# Patient Record
Sex: Male | Born: 1995 | Hispanic: No | Marital: Single | Smoking: Never smoker
Health system: Southern US, Community
[De-identification: ages and names within clinical notes are randomized; demographics above are authoritative.]

## PROBLEM LIST (undated history)

## (undated) DIAGNOSIS — H518 Other specified disorders of binocular movement: Secondary | ICD-10-CM

## (undated) DIAGNOSIS — H5010 Unspecified exotropia: Secondary | ICD-10-CM

## (undated) DIAGNOSIS — T18108A Unspecified foreign body in esophagus causing other injury, initial encounter: Secondary | ICD-10-CM

## (undated) DIAGNOSIS — H02402 Unspecified ptosis of left eyelid: Secondary | ICD-10-CM

## (undated) DIAGNOSIS — H5210 Myopia, unspecified eye: Secondary | ICD-10-CM

## (undated) DIAGNOSIS — H5500 Unspecified nystagmus: Secondary | ICD-10-CM

## (undated) DIAGNOSIS — H52209 Unspecified astigmatism, unspecified eye: Secondary | ICD-10-CM

## (undated) DIAGNOSIS — H53002 Unspecified amblyopia, left eye: Secondary | ICD-10-CM

## (undated) HISTORY — PX: FRONTALIS SUSPENSION: SHX1688

## (undated) HISTORY — PX: TONSILLECTOMY: SUR1361

## (undated) HISTORY — PX: OTHER SURGICAL HISTORY: SHX169

## (undated) HISTORY — PX: EYE SURGERY: SHX253

## (undated) HISTORY — PX: STRABISMUS SURGERY: SHX218

---
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
## 2013-12-27 ENCOUNTER — Ambulatory Visit: Payer: Self-pay | Admitting: Internal Medicine

## 2014-01-10 ENCOUNTER — Ambulatory Visit: Payer: Self-pay | Admitting: Physician Assistant

## 2014-08-28 ENCOUNTER — Emergency Department
Admit: 2014-08-28 | Disposition: A | Discharge status: Home / Self Care | Payer: Self-pay | Admitting: Emergency Medicine

## 2015-01-03 ENCOUNTER — Encounter: Payer: Self-pay | Admitting: Emergency Medicine

## 2015-01-03 ENCOUNTER — Emergency Department: Payer: Commercial Managed Care - HMO | Admitting: Anesthesiology

## 2015-01-03 ENCOUNTER — Encounter
Admission: EM | Disposition: A | Discharge status: Home / Self Care | Payer: Self-pay | Source: Home / Self Care | Attending: Emergency Medicine

## 2015-01-03 ENCOUNTER — Emergency Department
Admission: EM | Admit: 2015-01-03 | Discharge: 2015-01-03 | Disposition: A | Discharge status: Home / Self Care | Payer: Commercial Managed Care - HMO | Attending: Emergency Medicine | Admitting: Emergency Medicine

## 2015-01-03 DIAGNOSIS — T18128A Food in esophagus causing other injury, initial encounter: Secondary | ICD-10-CM | POA: Insufficient documentation

## 2015-01-03 DIAGNOSIS — K2970 Gastritis, unspecified, without bleeding: Secondary | ICD-10-CM | POA: Diagnosis not present

## 2015-01-03 DIAGNOSIS — X58XXXA Exposure to other specified factors, initial encounter: Secondary | ICD-10-CM | POA: Diagnosis not present

## 2015-01-03 DIAGNOSIS — K222 Esophageal obstruction: Secondary | ICD-10-CM | POA: Diagnosis not present

## 2015-01-03 DIAGNOSIS — Y939 Activity, unspecified: Secondary | ICD-10-CM | POA: Insufficient documentation

## 2015-01-03 DIAGNOSIS — Y929 Unspecified place or not applicable: Secondary | ICD-10-CM | POA: Insufficient documentation

## 2015-01-03 DIAGNOSIS — R1310 Dysphagia, unspecified: Secondary | ICD-10-CM | POA: Diagnosis present

## 2015-01-03 HISTORY — PX: ESOPHAGOGASTRODUODENOSCOPY (EGD) WITH PROPOFOL: SHX5813

## 2015-01-03 LAB — COMPREHENSIVE METABOLIC PANEL
ALT: 19 U/L (ref 17–63)
AST: 25 U/L (ref 15–41)
Albumin: 4.6 g/dL (ref 3.5–5.0)
Alkaline Phosphatase: 73 U/L (ref 38–126)
Anion gap: 7 (ref 5–15)
BILIRUBIN TOTAL: 0.3 mg/dL (ref 0.3–1.2)
BUN: 14 mg/dL (ref 6–20)
CO2: 28 mmol/L (ref 22–32)
CREATININE: 1.21 mg/dL (ref 0.61–1.24)
Calcium: 9.6 mg/dL (ref 8.9–10.3)
Chloride: 106 mmol/L (ref 101–111)
GFR calc Af Amer: >60 mL/min (ref >60)
Glucose, Bld: 71 mg/dL (ref 65–99)
Potassium: 4.2 mmol/L (ref 3.5–5.1)
Sodium: 141 mmol/L (ref 135–145)
TOTAL PROTEIN: 7.9 g/dL (ref 6.5–8.1)

## 2015-01-03 LAB — CBC
HCT: 46.9 % (ref 40.0–52.0)
Hemoglobin: 15.6 g/dL (ref 13.0–18.0)
MCH: 26 pg (ref 26.0–34.0)
MCHC: 33.3 g/dL (ref 32.0–36.0)
MCV: 78.2 fL — ABNORMAL LOW (ref 80.0–100.0)
PLATELETS: 289 10*3/uL (ref 150–440)
RBC: 6 MIL/uL — ABNORMAL HIGH (ref 4.40–5.90)
RDW: 15.3 % — AB (ref 11.5–14.5)
WBC: 6.9 10*3/uL (ref 3.8–10.6)

## 2015-01-03 SURGERY — ESOPHAGOGASTRODUODENOSCOPY (EGD) WITH PROPOFOL
Anesthesia: General

## 2015-01-03 MED ORDER — DEXAMETHASONE SODIUM PHOSPHATE 4 MG/ML IJ SOLN
INTRAMUSCULAR | Status: DC | PRN
  Administered 2015-01-03: 5 mg via INTRAVENOUS

## 2015-01-03 MED ORDER — FENTANYL CITRATE (PF) 100 MCG/2ML IJ SOLN: INTRAMUSCULAR | Status: DC | PRN

## 2015-01-03 MED ORDER — OXYCODONE HCL 5 MG PO TABS: 5.0000 mg | ORAL_TABLET | Freq: Once | ORAL | Status: DC | PRN

## 2015-01-03 MED ORDER — MIDAZOLAM HCL 2 MG/2ML IJ SOLN
INTRAMUSCULAR | Status: DC | PRN
  Administered 2015-01-03: 2 mg via INTRAVENOUS

## 2015-01-03 MED ORDER — OXYCODONE HCL 5 MG/5ML PO SOLN: 5.0000 mg | Freq: Once | ORAL | Status: DC | PRN

## 2015-01-03 MED ORDER — PROPOFOL 10 MG/ML IV BOLUS
INTRAVENOUS | Status: DC | PRN
  Administered 2015-01-03: 200 mg via INTRAVENOUS

## 2015-01-03 MED ORDER — SODIUM CHLORIDE 0.9 % IV SOLN
INTRAVENOUS | Status: DC
  Administered 2015-01-03: 1000 mL via INTRAVENOUS

## 2015-01-03 MED ORDER — SUCCINYLCHOLINE CHLORIDE 20 MG/ML IJ SOLN
INTRAMUSCULAR | Status: DC | PRN
  Administered 2015-01-03: 120 mg via INTRAVENOUS

## 2015-01-03 MED ORDER — FENTANYL CITRATE (PF) 100 MCG/2ML IJ SOLN
INTRAMUSCULAR | Status: DC | PRN
  Administered 2015-01-03: 50 ug via INTRAVENOUS

## 2015-01-03 MED ORDER — ROCURONIUM BROMIDE 100 MG/10ML IV SOLN
INTRAVENOUS | Status: DC | PRN
  Administered 2015-01-03: 10 mg via INTRAVENOUS

## 2015-01-03 MED ORDER — LIDOCAINE HCL (CARDIAC) 20 MG/ML IV SOLN
INTRAVENOUS | Status: DC | PRN
  Administered 2015-01-03 (×2): 100 mg via INTRAVENOUS

## 2015-01-03 MED ORDER — FENTANYL CITRATE (PF) 100 MCG/2ML IJ SOLN: 25.0000 ug | INTRAMUSCULAR | Status: DC | PRN

## 2015-01-03 MED ORDER — ONDANSETRON HCL 4 MG/2ML IJ SOLN
INTRAMUSCULAR | Status: DC | PRN
Start: 2015-01-03 — End: 2015-01-03
  Administered 2015-01-03: 4 mg via INTRAVENOUS

## 2015-01-03 NOTE — Consult Note (Signed)
   Primary Care Physician:  Pcp Not In System Primary Gastroenterologist:  Dr. Mechele Collin  Pre-Procedure History & Physical: HPI:  Rodney Callahan is a 19 y.o. male is here for an endoscopy.  He has a food impaction in esophagus while eating today and cannot swallow saliva or carbonated beverages.   History reviewed. No pertinent past medical history.  Past Surgical History  Procedure Laterality Date  . Eye surgery      Prior to Admission medications   Not on File    Allergies as of 01/03/2015  . (No Known Allergies)    History reviewed. No pertinent family history.  Social History   Social History  . Marital Status: Single    Spouse Name: N/A  . Number of Children: N/A  . Years of Education: N/A   Occupational History  . Not on file.   Social History Main Topics  . Smoking status: Never Smoker   . Smokeless tobacco: Not on file  . Alcohol Use: No  . Drug Use: Not on file  . Sexual Activity: Not on file   Other Topics Concern  . Not on file   Social History Narrative  . No narrative on file    Review of Systems: See HPI, otherwise negative ROS  Physical Exam: BP 166/85 mmHg  Pulse 88  Temp(Src) 98.7 F (37.1 C) (Oral)  Resp 18  Ht  (2.032 m)  Wt 117.935 kg (260 lb)  BMI 28.56 kg/m2  SpO2 100% General:   Alert,  pleasant and cooperative in NAD, Pt unable to swallow saliva, carbonated beverages did not make the food bolus go down. Head:  Normocephalic and atraumatic. Neck:  Supple; no masses or thyromegaly. Lungs:  Clear throughout to auscultation.    Heart:  Regular rate and rhythm. Abdomen:  Soft, nontender and nondistended. Normal bowel sounds, without guarding, and without rebound.   Neurologic:  Alert and  oriented x4;  grossly normal neurologically.  Impression/Plan: Rodney Callahan is here for an endoscopy to be performed for foreign body removal from esophagus.  Risks, benefits, limitations, and alternatives  regarding  endoscopy have been reviewed with the patient.  Questions have been answered.  All parties agreeable.   Lynnae Prude, MD  01/03/2015, 6:03 PM

## 2015-01-03 NOTE — Discharge Instructions (Signed)

## 2015-01-03 NOTE — ED Provider Notes (Signed)
[facility] Emergency Department Provider Note     Time seen: ----------------------------------------- 5:03 PM on 01/03/2015 -----------------------------------------    I have reviewed the triage vital signs and the nursing notes.   HISTORY  Chief Complaint Emesis and Dysphagia    HPI Oneal Schoenberger is a 19 y.o. male who presents ER for trouble swallowing since 2 PM patient states he was eating a fish fajita and then abruptly afterwards he felt it get Stuck in his throat and he is been unable to swallow since that period time.Patient can no longer tolerate even swallowing saliva, has tried vomiting but is not able to bring anything up.   History reviewed. No pertinent past medical history.  There are no active problems to display for this patient.   Past Surgical History  Procedure Laterality Date  . Eye surgery      Allergies Review of patient's allergies indicates no known allergies.  Social History Social History  Substance Use Topics  . Smoking status: Never Smoker   . Smokeless tobacco: None  . Alcohol Use: No    Review of Systems Constitutional: Negative for fever. Eyes: Negative for visual changes. ENT: Negative for sore throat. Positive for foreign body sensation Cardiovascular: Negative for chest pain. Respiratory: Negative for shortness of breath. Gastrointestinal: Negative for abdominal pain, positive for vomiting Genitourinary: Negative for dysuria. Musculoskeletal: Negative for back pain. Skin: Negative for rash. Neurological: Negative for headaches, focal weakness or numbness.  10-point ROS otherwise negative.  ____________________________________________   PHYSICAL EXAM:  VITAL SIGNS: ED Triage Vitals  Enc Vitals Group     BP 01/03/15 1616 166/85 mmHg     Pulse Rate 01/03/15 1616 88     Resp 01/03/15 1616 18     Temp 01/03/15 1616 98.7 F (37.1 C)     Temp Source 01/03/15 1616 Oral     SpO2  01/03/15 1616 100 %     Weight 01/03/15 1616 260 lb (117.935 kg)     Height 01/03/15 1620 6\' 8"  (2.032 m)     Head Cir --      Peak Flow --      Pain Score 01/03/15 1618 5     Pain Loc --      Pain Edu? --      Excl. in GC? --     Constitutional: Alert and oriented. Mild distress Eyes: Conjunctivae are normal. PERRL. Normal extraocular movements. ENT   Head: Normocephalic and atraumatic.   Nose: No congestion/rhinnorhea.   Mouth/Throat: Mucous membranes are moist. Pharyngeal erythema is present   Neck: No stridor. Cardiovascular: Normal rate, regular rhythm. Normal and symmetric distal pulses are present in all extremities. No murmurs, rubs, or gallops. Respiratory: Normal respiratory effort without tachypnea nor retractions. Breath sounds are clear and equal bilaterally. No wheezes/rales/rhonchi. Gastrointestinal: Soft and nontender. No distention. No abdominal bruits.  Musculoskeletal: Nontender with normal range of motion in all extremities. No joint effusions.  No lower extremity tenderness nor edema. Neurologic:  Normal speech and language. No gross focal neurologic deficits are appreciated. Speech is normal. No gait instability. Skin:  Skin is warm, dry and intact. No rash noted. Psychiatric: Mood and affect are normal. Speech and behavior are normal. Patient exhibits appropriate insight and judgment.  ____________________________________________  ED COURSE:  Pertinent labs & imaging results that were available during my care of the patient were reviewed by me and considered in my medical decision making (see chart for details). Patient clinically with esophageal food impaction,  will discuss with GI. Likely he'll need endoscopy and bolus extraction ____________________________________________    LABS (pertinent positives/negatives)  Labs Reviewed  CBC - Abnormal; Notable for the following:    RBC 6.00 (*)    MCV 78.2 (*)    RDW 15.3 (*)    All other  components within normal limits  COMPREHENSIVE METABOLIC PANEL    ____________________________________________  FINAL ASSESSMENT AND PLAN  Esophageal food impaction  Plan: Patient with labs and imaging as dictated above. Patient did not have success after taking an a carbonated beverage. Dr. Mechele Collin isn't to see the patient, will take to endoscopy to have food bolus removal.   Emily Filbert, MD   Emily Filbert, MD 01/03/15 [phone]

## 2015-01-03 NOTE — Transfer of Care (Signed)
Immediate Anesthesia Transfer of Care Note  Patient: Rodney Callahan  Procedure(s) Performed: Procedure(s): ESOPHAGOGASTRODUODENOSCOPY (EGD) WITH PROPOFOL (N/A)  Patient Location: PACU  Anesthesia Type:General  Level of Consciousness: sedated  Airway & Oxygen Therapy: Patient Spontanous Breathing and Patient connected to nasal cannula oxygen  Post-op Assessment: Report given to RN and Post -op Vital signs reviewed and stable  Post vital signs: Reviewed and stable  Last Vitals:  Filed Vitals:   01/03/15 1935  BP: 131/76  Pulse:   Temp: 36.3 C  Resp: 16    Complications: No apparent anesthesia complications

## 2015-01-03 NOTE — Anesthesia Procedure Notes (Signed)
Procedure Name: Intubation Date/Time: 01/03/2015 6:22 PM Performed by: Irving Burton Pre-anesthesia Checklist: Patient identified, Emergency Drugs available, Suction available and Patient being monitored Patient Re-evaluated:Patient Re-evaluated prior to inductionOxygen Delivery Method: Circle system utilized Preoxygenation: Pre-oxygenation with 100% oxygen Intubation Type: IV induction, Rapid sequence and Cricoid Pressure applied Laryngoscope Size: Mac and 4 Grade View: Grade II Tube type: Oral Tube size: 7.0 mm Number of attempts: 1 Airway Equipment and Method: Stylet Placement Confirmation: ETT inserted through vocal cords under direct vision,  positive ETCO2 and breath sounds checked- equal and bilateral Secured at: 24 cm Tube secured with: Tape Dental Injury: Teeth and Oropharynx as per pre-operative assessment

## 2015-01-03 NOTE — Op Note (Signed)
[facility] Gastroenterology Patient Name: Rodney Callahan Procedure Date: 01/03/2015 5:44 PM MRN: [id number] Account #: [id number] Date of Birth: Dec 10, 1995 Admit Type: Inpatient Age: 19 Room: [facility] ENDO ROOM 1 Gender: Male Note Status: Finalized Procedure:         Upper GI endoscopy Indications:       Foreign body in the esophagus Providers:         Scot Jun, MD Referring MD:      No Local Md, MD (Referring MD) Medicines:         General Anesthesia Complications:     No immediate complications. Procedure:         Pre-Anesthesia Assessment:                    - After reviewing the risks and benefits, the patient was                     deemed in satisfactory condition to undergo the procedure.                    After obtaining informed consent, the endoscope was passed                     under direct vision. Throughout the procedure, the                     patient's blood pressure, pulse, and oxygen saturations                     were monitored continuously. The Olympus GIF-160 endoscope                     (S#. [id number]) was introduced through the mouth, and                     advanced to the second part of duodenum. The upper GI                     endoscopy was accomplished without difficulty. The patient                     tolerated the procedure well. Findings:      A low-grade of narrowing, mild and moderate but multiple Schatzki rings       (acquired) was found in the middle third of the esophagus and in the       lower third of the esophagus.      Food was found in the lower third of the esophagus. Removal of food was       accomplished. Using a raptor forceps and a snare. Some was removed and       some was pushed into the stomach.      Patchy minimal inflammation characterized by erythema and granularity       was found in the gastric body.      The examined duodenum was normal. Impression:        - Low-grade of narrowing, mild and moderate  Schatzki ring.                    - Food in the lower third of the esophagus. Removal was                     successful.                    -  Gastritis.                    - Normal examined duodenum. Recommendation:    - soft food for 3 days, eat slowly, chew well, take small                     bites. Schedule return endoscopy to dilate esophagus. Scot Jun, MD 01/03/2015 7:17:34 PM This report has been signed electronically. Number of Addenda: 0 Note Initiated On: 01/03/2015 5:44 PM      [facility]

## 2015-01-03 NOTE — ED Notes (Signed)
Pt to endo via stretcher with nurse and orderly.  Pt alert.  Family with pt.

## 2015-01-03 NOTE — ED Notes (Signed)
Pt presents with vomiting and trouble swallowing starting today.

## 2015-01-03 NOTE — ED Notes (Signed)
Pt has hx of delayed swallowing, states at 2pm he ate some fajitas and has not been able to swallow saliva ever since, pt spitting up in bag, pt able to speak in full sentances in no respiratory distress

## 2015-01-03 NOTE — Anesthesia Preprocedure Evaluation (Addendum)
Anesthesia Evaluation  Patient identified by MRN, date of birth, ID band Patient awake    Reviewed: Allergy & Precautions, H&P , NPO status , Patient's Chart, lab work & pertinent test results  Airway Mallampati: II  TM Distance: >3 FB Neck ROM: full    Dental no notable dental hx. (+) Teeth Intact   Pulmonary neg shortness of breath, asthma ,  breath sounds clear to auscultation  Pulmonary exam normal       Cardiovascular negative cardio ROS Normal cardiovascular examRhythm:regular Rate:Normal     Neuro/Psych negative neurological ROS  negative psych ROS   GI/Hepatic negative GI ROS, Neg liver ROS,   Endo/Other  negative endocrine ROS  Renal/GU negative Renal ROS  negative genitourinary   Musculoskeletal   Abdominal   Peds  Hematology negative hematology ROS (+)   Anesthesia Other Findings hx of delayed swallowing, states at 2pm he ate some fajitas and has not been able to swallow saliva ever since   Reproductive/Obstetrics negative OB ROS                            Anesthesia Physical Anesthesia Plan  ASA: III and emergent  Anesthesia Plan: General ETT   Post-op Pain Management:    Induction: Rapid sequence and Intravenous  Airway Management Planned:   Additional Equipment:   Intra-op Plan:   Post-operative Plan:   Informed Consent: I have reviewed the patients History and Physical, chart, labs and discussed the procedure including the risks, benefits and alternatives for the proposed anesthesia with the patient or authorized representative who has indicated his/her understanding and acceptance.   Dental Advisory Given  Plan Discussed with: Anesthesiologist, CRNA and Surgeon  Anesthesia Plan Comments:         Anesthesia Quick Evaluation

## 2015-01-04 ENCOUNTER — Encounter: Payer: Self-pay | Admitting: Unknown Physician Specialty

## 2015-01-04 NOTE — Anesthesia Postprocedure Evaluation (Signed)
  Anesthesia Post-op Note  Patient: Rodney Callahan  Procedure(s) Performed: Procedure(s): ESOPHAGOGASTRODUODENOSCOPY (EGD) WITH PROPOFOL (N/A)  Anesthesia type:General ETT  Patient location: PACU  Post pain: Pain level controlled  Post assessment: Post-op Vital signs reviewed, Patient's Cardiovascular Status Stable, Respiratory Function Stable, Patent Airway and No signs of Nausea or vomiting  Post vital signs: Reviewed and stable  Last Vitals:  Filed Vitals:   01/03/15 2005  BP:   Pulse:   Temp: 36.6 C  Resp:     Level of consciousness: awake, alert  and patient cooperative  Complications: No apparent anesthesia complications

## 2015-04-26 ENCOUNTER — Encounter: Payer: Self-pay | Admitting: *Deleted

## 2015-04-29 ENCOUNTER — Ambulatory Visit: Payer: Commercial Managed Care - HMO | Admitting: Anesthesiology

## 2015-04-29 ENCOUNTER — Encounter
Admission: RE | Disposition: A | Discharge status: Home / Self Care | Payer: Self-pay | Source: Ambulatory Visit | Attending: Unknown Physician Specialty

## 2015-04-29 ENCOUNTER — Ambulatory Visit
Admission: RE | Admit: 2015-04-29 | Discharge: 2015-04-29 | Disposition: A | Discharge status: Home / Self Care | Payer: Commercial Managed Care - HMO | Source: Ambulatory Visit | Attending: Unknown Physician Specialty | Admitting: Unknown Physician Specialty

## 2015-04-29 ENCOUNTER — Encounter: Payer: Self-pay | Admitting: *Deleted

## 2015-04-29 DIAGNOSIS — H5210 Myopia, unspecified eye: Secondary | ICD-10-CM | POA: Insufficient documentation

## 2015-04-29 DIAGNOSIS — R1310 Dysphagia, unspecified: Secondary | ICD-10-CM | POA: Diagnosis present

## 2015-04-29 DIAGNOSIS — H578 Other specified disorders of eye and adnexa: Secondary | ICD-10-CM | POA: Diagnosis not present

## 2015-04-29 DIAGNOSIS — H53002 Unspecified amblyopia, left eye: Secondary | ICD-10-CM | POA: Insufficient documentation

## 2015-04-29 DIAGNOSIS — H5500 Unspecified nystagmus: Secondary | ICD-10-CM | POA: Insufficient documentation

## 2015-04-29 DIAGNOSIS — K222 Esophageal obstruction: Secondary | ICD-10-CM | POA: Insufficient documentation

## 2015-04-29 DIAGNOSIS — K2289 Other specified disease of esophagus: Secondary | ICD-10-CM

## 2015-04-29 DIAGNOSIS — K228 Other specified diseases of esophagus: Secondary | ICD-10-CM

## 2015-04-29 DIAGNOSIS — H52209 Unspecified astigmatism, unspecified eye: Secondary | ICD-10-CM | POA: Insufficient documentation

## 2015-04-29 HISTORY — DX: Unspecified nystagmus: H55.00

## 2015-04-29 HISTORY — DX: Unspecified amblyopia, left eye: H53.002

## 2015-04-29 HISTORY — PX: ESOPHAGOGASTRODUODENOSCOPY (EGD) WITH PROPOFOL: SHX5813

## 2015-04-29 HISTORY — DX: Other specified disorders of binocular movement: H51.8

## 2015-04-29 HISTORY — PX: SAVORY DILATION: SHX5439

## 2015-04-29 HISTORY — DX: Unspecified ptosis of left eyelid: H02.402

## 2015-04-29 HISTORY — DX: Unspecified exotropia: H50.10

## 2015-04-29 HISTORY — DX: Myopia, unspecified eye: H52.10

## 2015-04-29 HISTORY — DX: Unspecified astigmatism, unspecified eye: H52.209

## 2015-04-29 SURGERY — ESOPHAGOGASTRODUODENOSCOPY (EGD) WITH PROPOFOL
Anesthesia: General

## 2015-04-29 MED ORDER — BUTAMBEN-TETRACAINE-BENZOCAINE 2-2-14 % EX AERO
INHALATION_SPRAY | CUTANEOUS | Status: DC | PRN
  Administered 2015-04-29: 1 via TOPICAL

## 2015-04-29 MED ORDER — PROPOFOL 500 MG/50ML IV EMUL
INTRAVENOUS | Status: DC | PRN
  Administered 2015-04-29: 100 ug/kg/min via INTRAVENOUS

## 2015-04-29 MED ORDER — LIDOCAINE HCL (PF) 2 % IJ SOLN
INTRAMUSCULAR | Status: DC | PRN
  Administered 2015-04-29: 60 mg

## 2015-04-29 MED ORDER — MIDAZOLAM HCL 5 MG/5ML IJ SOLN
INTRAMUSCULAR | Status: DC | PRN
  Administered 2015-04-29 (×2): 1 mg via INTRAVENOUS

## 2015-04-29 MED ORDER — FENTANYL CITRATE (PF) 100 MCG/2ML IJ SOLN
INTRAMUSCULAR | Status: DC | PRN
  Administered 2015-04-29: 25 ug via INTRAVENOUS
  Administered 2015-04-29: 50 ug via INTRAVENOUS
  Administered 2015-04-29: 25 ug via INTRAVENOUS

## 2015-04-29 MED ORDER — PROPOFOL 10 MG/ML IV BOLUS
INTRAVENOUS | Status: DC | PRN
  Administered 2015-04-29: 50 mg via INTRAVENOUS

## 2015-04-29 MED ORDER — SODIUM CHLORIDE 0.9 % IV SOLN
INTRAVENOUS | Status: DC
  Administered 2015-04-29: 10:00:00 via INTRAVENOUS

## 2015-04-29 MED ORDER — GLYCOPYRROLATE 0.2 MG/ML IJ SOLN
INTRAMUSCULAR | Status: DC | PRN
  Administered 2015-04-29: 0.2 mg via INTRAVENOUS

## 2015-04-29 MED ORDER — SODIUM CHLORIDE 0.9 % IV SOLN: INTRAVENOUS | Status: DC

## 2015-04-29 NOTE — Anesthesia Postprocedure Evaluation (Signed)
Anesthesia Post Note  Patient: Delmer IslamKalen Christopher Armor  Procedure(s) Performed: Procedure(s) (LRB): ESOPHAGOGASTRODUODENOSCOPY (EGD) WITH PROPOFOL (N/A) SAVORY DILATION (N/A)  Patient location during evaluation: PACU Anesthesia Type: General Level of consciousness: awake Pain management: satisfactory to patient Vital Signs Assessment: post-procedure vital signs reviewed and stable Respiratory status: respiratory function stable Cardiovascular status: stable Anesthetic complications: no    Last Vitals:  Filed Vitals:   04/29/15 1112 04/29/15 1122  BP: 120/52 112/57  Pulse: 63 59  Temp:    Resp: 19 19    Last Pain: There were no vitals filed for this visit.               VAN STAVEREN,Chesky Heyer

## 2015-04-29 NOTE — Transfer of Care (Signed)
Immediate Anesthesia Transfer of Care Note  Patient: Rodney Callahan  Procedure(s) Performed: Procedure(s): ESOPHAGOGASTRODUODENOSCOPY (EGD) WITH PROPOFOL (N/A) SAVORY DILATION (N/A)  Patient Location: PACU  Anesthesia Type:General  Level of Consciousness: sedated  Airway & Oxygen Therapy: Patient Spontanous Breathing and Patient connected to nasal cannula oxygen  Post-op Assessment: Report given to RN and Post -op Vital signs reviewed and stable  Post vital signs: Reviewed and stable  Last Vitals:  Filed Vitals:   04/29/15 1004  BP: 128/61  Pulse: 60  Temp: 36.9 C  Resp: 48    Complications: No apparent anesthesia complications

## 2015-04-29 NOTE — Anesthesia Preprocedure Evaluation (Signed)
Anesthesia Evaluation  Patient identified by MRN, date of birth, ID band Patient awake    Reviewed: Allergy & Precautions, NPO status , Patient's Chart, lab work & pertinent test results  Airway Mallampati: I       Dental no notable dental hx.    Pulmonary neg pulmonary ROS,    Pulmonary exam normal        Cardiovascular negative cardio ROS   Rhythm:Regular     Neuro/Psych    GI/Hepatic negative GI ROS, Neg liver ROS,   Endo/Other  negative endocrine ROS  Renal/GU negative Renal ROS     Musculoskeletal   Abdominal Normal abdominal exam  (+)   Peds negative pediatric ROS (+)  Hematology negative hematology ROS (+)   Anesthesia Other Findings   Reproductive/Obstetrics                             Anesthesia Physical Anesthesia Plan  ASA: I  Anesthesia Plan: General   Post-op Pain Management:    Induction: Intravenous  Airway Management Planned: Nasal Cannula  Additional Equipment:   Intra-op Plan:   Post-operative Plan:   Informed Consent: I have reviewed the patients History and Physical, chart, labs and discussed the procedure including the risks, benefits and alternatives for the proposed anesthesia with the patient or authorized representative who has indicated his/her understanding and acceptance.     Plan Discussed with: CRNA  Anesthesia Plan Comments:         Anesthesia Quick Evaluation

## 2015-04-29 NOTE — H&P (Signed)
   Primary Care Physician:  Gardiner RhymeENDORFF,REBECCA, MD Primary Gastroenterologist:  Dr. Mechele CollinElliott  Pre-Procedure History & Physical: HPI:  Rodney Callahan is a 19 y.o. male is here for an endoscopy.   Past Medical History  Diagnosis Date  . Amblyopia of left eye   . Astigmatism   . DVD (dissociated vertical deviation)     Right Eye  . Exotropia   . Myopia   . Nystagmus   . Ptosis of left eyelid     Past Surgical History  Procedure Laterality Date  . Tonsillectomy    . Esophagogastroduodenoscopy (egd) with propofol N/A 01/03/2015    Procedure: ESOPHAGOGASTRODUODENOSCOPY (EGD) WITH PROPOFOL;  Surgeon: Scot Junobert T Elliott, MD;  [facility];  Service: Endoscopy;  Laterality: N/A;  . Eye surgery      eyelid surgery; frontalis suspension  . Eye surgery      strabismus eye surgery; bilat; lateral rectus recession  . Eye surgery      strabismus eye surgery; bilat; superior recuts recessions - OU; medial rectus resection - OS  . Eye surgery      strabismus eye surgery; Left; inferior oblique anteriorization w/dissection of scar tissue    Prior to Admission medications   Not on File    Allergies as of 04/25/2015  . (No Known Allergies)    History reviewed. No pertinent family history.  Social History   Social History  . Marital Status: Single    Spouse Name: N/A  . Number of Children: N/A  . Years of Education: N/A   Occupational History  . Not on file.   Social History Main Topics  . Smoking status: Never Smoker   . Smokeless tobacco: Never Used  . Alcohol Use: Yes  . Drug Use: No  . Sexual Activity: Not on file   Other Topics Concern  . Not on file   Social History Narrative    Review of Systems: See HPI, otherwise negative ROS  Physical Exam: BP 128/61 mmHg  Pulse 60  Temp(Src) 98.4 F (36.9 C) (Tympanic)  Resp 48  Ht 6\' 8"  (2.032 m)  Wt 117.935 kg (260 lb)  BMI 28.56 kg/m2  SpO2 99% General:   Alert,  pleasant and cooperative in  NAD Head:  Normocephalic and atraumatic. Neck:  Supple; no masses or thyromegaly. Lungs:  Clear throughout to auscultation.    Heart:  Regular rate and rhythm. Abdomen:  Soft, nontender and nondistended. Normal bowel sounds, without guarding, and without rebound.   Neurologic:  Alert and  oriented x4;  grossly normal neurologically.  Impression/Plan: Rodney Callahan is here for an endoscopy to be performed for dysphagia, esophageal rings  Risks, benefits, limitations, and alternatives regarding  endoscopy have been reviewed with the patient.  Questions have been answered.  All parties agreeable.   Lynnae PrudeELLIOTT, ROBERT, MD  04/29/2015, 10:32 AM

## 2015-04-29 NOTE — Op Note (Signed)
[facility] Gastroenterology Patient Name: Rodney JewsKalen River Procedure Date: 04/29/2015 10:34 AM MRN: [id number] Account #: [id number] Date of Birth: 11/25/1995 Admit Type: Outpatient Age: [age] [facility] Gender: Male Note Status: Finalized Procedure:         Upper GI endoscopy Indications:       Dysphagia, narrowing of the esophagus causing previous                     food bolus obstruction of the esophagus requiring EGD with                     FB removal. Providers:         Scot Junobert T. Elliott, MD Referring MD:      No Local Md, MD (Referring MD) Medicines:         Propofol per Anesthesia Complications:     No immediate complications. Procedure:         Pre-Anesthesia Assessment:                    - After reviewing the risks and benefits, the patient was                     deemed in satisfactory condition to undergo the procedure.                    After obtaining informed consent, the endoscope was passed                     under direct vision. Throughout the procedure, the                     patient's blood pressure, pulse, and oxygen saturations                     were monitored continuously. The Olympus GIF-160 endoscope                     (S#. [id number]) was introduced through the mouth, and                     advanced to the second part of duodenum. The upper GI                     endoscopy was somewhat difficult due to abnormal anatomy.                     The patient tolerated the procedure well. Findings:      Mucosal changes including ringed esophagus were found in the upper third       of the esophagus. At the end of the procedure a guidewire was placed and       the scope was withdrawn. Dilation was performed with a Savary dilator       55F with moderate resistance at 35cm.. A 13F would not pass the esophagus      This area of the upper esophagus was biopsied with a cold forceps for       histology. Biopsies were taken with a cold  forceps for histology. Done       before the dilatation.      There was extrinsic narrowing of the esophagus at about 35cm. The       esophagus mucosa was normal at this area. GEJ was 44cm.  Patchy mildly erythematous mucosa without bleeding was found in the       stomach.      The examined duodenum was normal. 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 Dilated. Biopsied.                    - Erythematous mucosa in the stomach.                    - Normal examined duodenum. Recommendation:    - soft food for 3 days, eat slowly, chew well, take small                     bites. Schedule CT of chest. Scot Jun, MD 04/29/2015 11:11:26 AM This report has been signed electronically. Number of Addenda: 0 Note Initiated On: 04/29/2015 10:34 AM      [facility]

## 2015-04-30 LAB — SURGICAL PATHOLOGY

## 2015-05-01 ENCOUNTER — Encounter: Payer: Self-pay | Admitting: Unknown Physician Specialty

## 2015-05-02 ENCOUNTER — Other Ambulatory Visit: Payer: Self-pay | Admitting: Unknown Physician Specialty

## 2015-05-02 DIAGNOSIS — K228 Other specified diseases of esophagus: Secondary | ICD-10-CM

## 2015-05-02 DIAGNOSIS — K2289 Other specified disease of esophagus: Secondary | ICD-10-CM

## 2015-05-03 ENCOUNTER — Ambulatory Visit
Admit: 2015-05-03 | Discharge: 2015-05-03 | Disposition: A | Discharge status: Home / Self Care | Payer: Commercial Managed Care - HMO | Attending: Unknown Physician Specialty | Admitting: Unknown Physician Specialty

## 2015-05-10 ENCOUNTER — Ambulatory Visit: Payer: Commercial Managed Care - HMO | Attending: Unknown Physician Specialty

## 2015-10-09 ENCOUNTER — Ambulatory Visit
Admission: EM | Admit: 2015-10-09 | Discharge: 2015-10-09 | Disposition: A | Discharge status: Home / Self Care | Payer: Commercial Managed Care - HMO | Attending: Family Medicine | Admitting: Family Medicine

## 2015-10-09 ENCOUNTER — Encounter: Payer: Self-pay | Admitting: *Deleted

## 2015-10-09 DIAGNOSIS — H109 Unspecified conjunctivitis: Secondary | ICD-10-CM | POA: Diagnosis not present

## 2015-10-09 MED ORDER — ERYTHROMYCIN 5 MG/GM OP OINT: 1.0000 "application " | TOPICAL_OINTMENT | Freq: Four times a day (QID) | OPHTHALMIC | Status: AC

## 2015-10-09 NOTE — Discharge Instructions (Signed)
Take medication as prescribed. Avoid rubbing eyes. Good hand hygiene.  ° °Follow up with your primary care physician this week as needed. Return to Urgent care for new or worsening concerns.  ° ° °Bacterial Conjunctivitis °Bacterial conjunctivitis, commonly called pink eye, is an inflammation of the clear membrane that covers the white part of the eye (conjunctiva). The inflammation can also happen on the underside of the eyelids. The blood vessels in the conjunctiva become inflamed, causing the eye to become red or pink. Bacterial conjunctivitis may spread easily from one eye to another and from person to person (contagious).  °CAUSES  °Bacterial conjunctivitis is caused by bacteria. The bacteria may come from your own skin, your upper respiratory tract, or from someone else with bacterial conjunctivitis. °SYMPTOMS  °The normally white color of the eye or the underside of the eyelid is usually pink or red. The pink eye is usually associated with irritation, tearing, and some sensitivity to light. Bacterial conjunctivitis is often associated with a thick, yellowish discharge from the eye. The discharge may turn into a crust on the eyelids overnight, which causes your eyelids to stick together. If a discharge is present, there may also be some blurred vision in the affected eye. °DIAGNOSIS  °Bacterial conjunctivitis is diagnosed by your caregiver through an eye exam and the symptoms that you report. Your caregiver looks for changes in the surface tissues of your eyes, which may point to the specific type of conjunctivitis. A sample of any discharge may be collected on a cotton-tip swab if you have a severe case of conjunctivitis, if your cornea is affected, or if you keep getting repeat infections that do not respond to treatment. The sample will be sent to a lab to see if the inflammation is caused by a bacterial infection and to see if the infection will respond to antibiotic medicines. °TREATMENT  °· Bacterial  conjunctivitis is treated with antibiotics. Antibiotic eyedrops are most often used. However, antibiotic ointments are also available. Antibiotics pills are sometimes used. Artificial tears or eye washes may ease discomfort. °HOME CARE INSTRUCTIONS  °· To ease discomfort, apply a cool, clean washcloth to your eye for 10-20 minutes, 3-4 times a day. °· Gently wipe away any drainage from your eye with a warm, wet washcloth or a cotton ball. °· Wash your hands often with soap and water. Use paper towels to dry your hands. °· Do not share towels or washcloths. This may spread the infection. °· Change or wash your pillowcase every day. °· You should not use eye makeup until the infection is gone. °· Do not operate machinery or drive if your vision is blurred. °· Stop using contact lenses. Ask your caregiver how to sterilize or replace your contacts before using them again. This depends on the type of contact lenses that you use. °· When applying medicine to the infected eye, do not touch the edge of your eyelid with the eyedrop bottle or ointment tube. °SEEK IMMEDIATE MEDICAL CARE IF:  °· Your infection has not improved within 3 days after beginning treatment. °· You had yellow discharge from your eye and it returns. °· You have increased eye pain. °· Your eye redness is spreading. °· Your vision becomes blurred. °· You have a fever or persistent symptoms for more than 2-3 days. °· You have a fever and your symptoms suddenly get worse. °· You have facial pain, redness, or swelling. °MAKE SURE YOU:  °· Understand these instructions. °· Will watch your condition. °·   Will get help right away if you are not doing well or get worse.   This information is not intended to replace advice given to you by your health care provider. Make sure you discuss any questions you have with your health care provider.   Document Released: 05/04/2005 Document Revised: 05/25/2014 Document Reviewed: 10/05/2011 Elsevier Interactive Patient  Education [employer]2016 [employer] Inc.

## 2015-10-09 NOTE — ED Notes (Signed)
Patient awoke this am with a swollen and red left eye. No other symptoms reported.

## 2015-10-09 NOTE — ED Provider Notes (Signed)
[facility]  ____________________________________________  Time seen: Approximately 5:08 PM  I have reviewed the triage vital signs and the nursing notes.   HISTORY  Chief Complaint Conjunctivitis  HPI  Rodney Callahan is a 20 y.o. malepresents for the complaint of one day of left eye redness, watering and itching. He reports complaints of left eye were present upon awakening this morning. Patient reports that he was fine last night. Denies foreign bodies, trauma, vision changes. Reports coworker with pink eye this past week. Denies recent sickness. Denies any fevers, cough, congestion, injury or trauma. Denies any other triggers. Reports this morning he did have some left eye matting with yellowish crusting. Denies seasonal allergies.   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 Patient again states no vision changes in his left eye. 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     Past Medical History  Diagnosis Date  . Amblyopia of left eye   . Astigmatism   . DVD (dissociated vertical deviation)     Right Eye  . Exotropia   . Myopia   . Nystagmus   . Ptosis of left eyelid     There are no active problems to display for this patient.   Past Surgical History  Procedure Laterality Date  . Tonsillectomy    . Esophagogastroduodenoscopy (egd) with propofol N/A 01/03/2015    Procedure: ESOPHAGOGASTRODUODENOSCOPY (EGD) WITH PROPOFOL;  Surgeon: Scot Junobert T Elliott, MD;  [facility];  Service: Endoscopy;  Laterality: N/A;  . Eye surgery      eyelid surgery; frontalis suspension  . Eye surgery      strabismus eye surgery; bilat; lateral rectus recession  . Eye surgery      strabismus eye surgery; bilat; superior recuts recessions - OU; medial rectus resection - OS  . Eye surgery      strabismus eye surgery; Left; inferior oblique anteriorization w/dissection of scar tissue  . Esophagogastroduodenoscopy (egd) with propofol N/A  04/29/2015    Procedure: ESOPHAGOGASTRODUODENOSCOPY (EGD) WITH PROPOFOL;  Surgeon: Scot Junobert T Elliott, MD;  [facility];  Service: Endoscopy;  Laterality: N/A;  . Savory dilation N/A 04/29/2015    Procedure: SAVORY DILATION;  Surgeon: Scot Junobert T Elliott, MD;  [facility];  Service: Endoscopy;  Laterality: N/A;    Current Outpatient Rx  Name  Route  Sig  Dispense  Refill  .             Allergies Review of patient's allergies indicates no known allergies.  History reviewed. No pertinent family history.  Social History Social History  Substance Use Topics  . Smoking status: Never Smoker   . Smokeless tobacco: Never Used  . Alcohol Use: Yes    Review of Systems Constitutional: No fever/chills Eyes: No visual changes. As above.  ENT: No sore throat. Cardiovascular: Denies chest pain. Respiratory: Denies shortness of breath. Gastrointestinal: No abdominal pain.  No nausea, no vomiting.  No diarrhea.  No constipation. Genitourinary: Negative for dysuria. Musculoskeletal: Negative for back pain. Skin: Negative for rash. Neurological: Negative for headaches, focal weakness or numbness.  10-point ROS otherwise negative.  ____________________________________________   PHYSICAL EXAM:  VITAL SIGNS: ED Triage Vitals  Enc Vitals Group     BP 10/09/15 1521 145/67 mmHg     Pulse Rate 10/09/15 1521 66     Resp 10/09/15 1521 18     Temp 10/09/15 1521 98.1 F (36.7 C)     Temp Source 10/09/15 1521 Oral  SpO2 10/09/15 1521 98 %     Weight 10/09/15 1521 270 lb (122.471 kg)     Height 10/09/15 1521  (2.032 m)     Head Cir --      Peak Flow --      Pain Score 10/09/15 1523 0     Pain Loc --      Pain Edu? --      Excl. in GC? --     Visual Acuity  Right Eye Distance: 20/20 Left Eye Distance: 20/200 Bilateral Distance: 20/20   Constitutional: Alert and oriented. Well appearing and in no acute distress. Eyes: Right conjunctiva normal appearing.  Left conjunctiva with mild injection, scant yellowish drainage, no foreign bodies visualized, slight left eyelid ptosis. No surrounding erythema or swelling bilaterally. No facial erythema.  PERRL. EOMI. No pain with EOMs.  Head: Atraumatic.  Ears: no erythema, normal TMs bilaterally.   Nose: No congestion/rhinnorhea.  Mouth/Throat: Mucous membranes are moist.  Oropharynx non-erythematous. Neck: No stridor.  No cervical spine tenderness to palpation. Cardiovascular: Normal rate, regular rhythm. Grossly normal heart sounds.  Good peripheral circulation. Respiratory: Normal respiratory effort.  No retractions. Lungs CTAB. No wheezes, rales or rhonchi.  Musculoskeletal: No lower or upper extremity tenderness nor edema.   Neurologic:  Normal speech and language. No gross focal neurologic deficits are appreciated. No gait instability. Skin:  Skin is warm, dry and intact. No rash noted. Psychiatric: Mood and affect are normal. Speech and behavior are normal.  ____________________________________________   LABS (all labs ordered are listed, but only abnormal results are displayed)  Labs Reviewed - No data to display  RADIOLOGY  No results found.   INITIAL IMPRESSION / ASSESSMENT AND PLAN / ED COURSE  Pertinent labs & imaging results that were available during my care of the patient were reviewed by me and considered in my medical decision making (see chart for details).  Very well appearing, no acute distress.  Presents for left eye redness, irritation and itching since waking up this am. Reports recent pink eye exposure from coworker. Left eye clinical appearance and subjective report, suspect left bacterial conjunctivitis. Will treat with erythromycin topical ointment. Encourage monitoring and follow up with PCP as needed. Good hand hygiene. Works in [employer], work note for today and tomorrow given. Discussed indication, risks and benefits of medications with patient.  Discussed  follow up with Primary care physician this week. Discussed follow up and return parameters including no resolution or any worsening concerns. Patient verbalized understanding and agreed to plan.   ____________________________________________   FINAL CLINICAL IMPRESSION(S) / ED DIAGNOSES  Final diagnoses:  Conjunctivitis, left eye     Discharge Medication List as of 10/09/2015  3:56 PM    START taking these medications   Details  erythromycin ophthalmic ointment Place 1 application into the left eye 4 (four) times daily. For seven days, Starting 10/09/2015, Until Wed 10/16/15, Normal        Note: This dictation was prepared with Dragon dictation along with smaller phrase technology. Any transcriptional errors that result from this process are unintentional.       Renford Dills, NP 10/09/15 1835

## 2015-11-20 ENCOUNTER — Emergency Department
Admission: EM | Admit: 2015-11-20 | Discharge: 2015-11-21 | Disposition: A | Discharge status: Home / Self Care | Payer: Commercial Managed Care - HMO | Attending: Emergency Medicine | Admitting: Emergency Medicine

## 2015-11-20 DIAGNOSIS — K222 Esophageal obstruction: Secondary | ICD-10-CM | POA: Diagnosis not present

## 2015-11-20 DIAGNOSIS — X58XXXA Exposure to other specified factors, initial encounter: Secondary | ICD-10-CM | POA: Diagnosis not present

## 2015-11-20 DIAGNOSIS — K209 Esophagitis, unspecified: Secondary | ICD-10-CM | POA: Insufficient documentation

## 2015-11-20 DIAGNOSIS — T18128A Food in esophagus causing other injury, initial encounter: Secondary | ICD-10-CM | POA: Insufficient documentation

## 2015-11-20 DIAGNOSIS — T18108A Unspecified foreign body in esophagus causing other injury, initial encounter: Secondary | ICD-10-CM

## 2015-11-20 LAB — CBC
HCT: 45.1 % (ref 40.0–52.0)
Hemoglobin: 15.5 g/dL (ref 13.0–18.0)
MCH: 26.3 pg (ref 26.0–34.0)
MCHC: 34.3 g/dL (ref 32.0–36.0)
MCV: 76.5 fL — ABNORMAL LOW (ref 80.0–100.0)
PLATELETS: 269 10*3/uL (ref 150–440)
RBC: 5.89 MIL/uL (ref 4.40–5.90)
RDW: 15.3 % — AB (ref 11.5–14.5)
WBC: 9.8 10*3/uL (ref 3.8–10.6)

## 2015-11-20 MED ORDER — GLUCAGON HCL (RDNA) 1 MG IJ SOLR
1.0000 mg | Freq: Once | INTRAMUSCULAR | Status: AC
  Administered 2015-11-20: 1 mg via INTRAVENOUS

## 2015-11-20 MED ORDER — GLUCAGON HCL RDNA (DIAGNOSTIC) 1 MG IJ SOLR
INTRAMUSCULAR | Status: AC
  Filled 2015-11-20: qty 1

## 2015-11-20 NOTE — ED Notes (Signed)
MD spoke to GI. Pt attempted cola again small pieces came up. 2nd attempt of cola nothing came up. Will continue to monitor.

## 2015-11-20 NOTE — ED Provider Notes (Signed)
[facility] Emergency Department Provider Note  ____________________________________________  Time seen: 11:00PM  I have reviewed the triage vital signs and the nursing notes.   HISTORY  Chief Complaint Foreign Body      HPI Rodney Callahan is a 20 y.o. male with history of esophageal stricture status post dilation 3 presents with sensation of having something struck his throat while eating chicken. Patient able to control oral secretions. Patient unable to continue eating and drinking and after the event. Onset of symptoms was 10:15 PM. Patient states that he was seen by Dr. Mechele CollinElliott in the past and had a dilation done of his esophagus.     Past Medical History  Diagnosis Date  . Amblyopia of left eye   . Astigmatism   . DVD (dissociated vertical deviation)     Right Eye  . Exotropia   . Myopia   . Nystagmus   . Ptosis of left eyelid     There are no active problems to display for this patient.   Past Surgical History  Procedure Laterality Date  . Tonsillectomy    . Esophagogastroduodenoscopy (egd) with propofol N/A 01/03/2015    Procedure: ESOPHAGOGASTRODUODENOSCOPY (EGD) WITH PROPOFOL;  Surgeon: Scot Junobert T Elliott, MD;  [facility];  Service: Endoscopy;  Laterality: N/A;  . Eye surgery      eyelid surgery; frontalis suspension  . Eye surgery      strabismus eye surgery; bilat; lateral rectus recession  . Eye surgery      strabismus eye surgery; bilat; superior recuts recessions - OU; medial rectus resection - OS  . Eye surgery      strabismus eye surgery; Left; inferior oblique anteriorization w/dissection of scar tissue  . Esophagogastroduodenoscopy (egd) with propofol N/A 04/29/2015    Procedure: ESOPHAGOGASTRODUODENOSCOPY (EGD) WITH PROPOFOL;  Surgeon: Scot Junobert T Elliott, MD;  [facility];  Service: Endoscopy;  Laterality: N/A;  . Savory dilation N/A 04/29/2015    Procedure: SAVORY DILATION;  Surgeon:  Scot Junobert T Elliott, MD;  [facility];  Service: Endoscopy;  Laterality: N/A;    No current outpatient prescriptions on file.  Allergies No known drug allergies No family history on file.  Social History Social History  Substance Use Topics  . Smoking status: Never Smoker   . Smokeless tobacco: Never Used  . Alcohol Use: Yes    Review of Systems  Constitutional: Negative for fever. Eyes: Negative for visual changes. ENT: Negative for sore throat. Cardiovascular: Negative for chest pain. Respiratory: Negative for shortness of breath. Gastrointestinal: Negative for abdominal pain, vomiting and diarrhea. Genitourinary: Negative for dysuria. Musculoskeletal: Negative for back pain. Skin: Negative for rash. Neurological: Negative for headaches, focal weakness or numbness.  10-point ROS otherwise negative.  ____________________________________________   PHYSICAL EXAM:  VITAL SIGNS: ED Triage Vitals  Enc Vitals Group     BP 11/20/15 2248 156/70 mmHg     Pulse Rate 11/20/15 2248 89     Resp 11/20/15 2248 18     Temp 11/20/15 2248 98.5 F (36.9 C)     Temp Source 11/20/15 2248 Oral     SpO2 11/20/15 2248 100 %     Weight 11/20/15 2248 260 lb (117.935 kg)     Height 11/20/15 2248 6\' 8"  (2.032 m)     Head Cir --      Peak Flow --      Pain Score --      Pain Loc --      Pain Edu? --  Excl. in GC? --     Constitutional: Alert and oriented. Actively spitting saliva  Eyes: Conjunctivae are normal. PERRL. Normal extraocular movements. ENT   Head: Normocephalic and atraumatic.   Nose: No congestion/rhinnorhea.   Mouth/Throat: Mucous membranes are moist.   Neck: No stridor. Hematological/Lymphatic/Immunilogical: No cervical lymphadenopathy. Cardiovascular: Normal rate, regular rhythm. Normal and symmetric distal pulses are present in all extremities. No murmurs, rubs, or gallops. Respiratory: Normal respiratory effort without tachypnea nor  retractions. Breath sounds are clear and equal bilaterally. No wheezes/rales/rhonchi. Gastrointestinal: Soft and nontender. No distention. There is no CVA tenderness. Genitourinary: deferred Musculoskeletal: Nontender with normal range of motion in all extremities. No joint effusions.  No lower extremity tenderness nor edema. Neurologic:  Normal speech and language. No gross focal neurologic deficits are appreciated. Speech is normal.  Skin:  Skin is warm, dry and intact. No rash noted. Psychiatric: Mood and affect are normal. Speech and behavior are normal. Patient exhibits appropriate insight and judgment.  ____________________________________________    LABS (pertinent positives/negatives)  Labs Reviewed  COMPREHENSIVE METABOLIC PANEL - Abnormal; Notable for the following:    Glucose, Bld 102 (*)    All other components within normal limits  CBC - Abnormal; Notable for the following:    MCV 76.5 (*)    RDW 15.3 (*)    All other components within normal limits      Procedures    INITIAL IMPRESSION / ASSESSMENT AND PLAN / ED COURSE  Pertinent labs & imaging results that were available during my care of the patient were reviewed by me and considered in my medical decision making (see chart for details).  Patient discussed with Dr. Mechele CollinElliott gastroenterologist on call after administration of 1 mg of glucagon IV as well as multiple attempts to drink a carbonated beverage without success of dislodgment of esophageal foreign body. Dr. Mechele CollinElliott presents to the emergency department and evaluated patient with plan to take the patient to endoscopy.  ____________________________________________   FINAL CLINICAL IMPRESSION(S) / ED DIAGNOSES  Final diagnoses:  Esophageal foreign body, initial encounter  Esophageal obstruction due to food impaction      Darci Currentandolph N Juliett Eastburn, MD 11/21/15 [phone]

## 2015-11-20 NOTE — ED Notes (Signed)
Pt was eating chicken and immediately felt like food stuck in throat, pt not able to swallow food or liquid. Hx of the same and had endoscopy done.

## 2015-11-20 NOTE — ED Notes (Signed)
MD attempt to get pt to drink cola to get food to dislodge unsuccessful.

## 2015-11-21 ENCOUNTER — Emergency Department: Payer: Commercial Managed Care - HMO | Admitting: Anesthesiology

## 2015-11-21 ENCOUNTER — Ambulatory Visit: Admit: 2015-11-21 | Payer: Self-pay | Admitting: Unknown Physician Specialty

## 2015-11-21 ENCOUNTER — Encounter
Admission: EM | Disposition: A | Discharge status: Home / Self Care | Payer: Self-pay | Source: Home / Self Care | Attending: Emergency Medicine

## 2015-11-21 ENCOUNTER — Emergency Department: Payer: Commercial Managed Care - HMO

## 2015-11-21 ENCOUNTER — Encounter: Payer: Self-pay | Admitting: Radiology

## 2015-11-21 HISTORY — PX: ESOPHAGOGASTRODUODENOSCOPY: SHX5428

## 2015-11-21 LAB — COMPREHENSIVE METABOLIC PANEL
ALT: 19 U/L (ref 17–63)
AST: 20 U/L (ref 15–41)
Albumin: 4.4 g/dL (ref 3.5–5.0)
Alkaline Phosphatase: 67 U/L (ref 38–126)
Anion gap: 6 (ref 5–15)
BILIRUBIN TOTAL: 0.4 mg/dL (ref 0.3–1.2)
BUN: 14 mg/dL (ref 6–20)
CO2: 27 mmol/L (ref 22–32)
Calcium: 9.1 mg/dL (ref 8.9–10.3)
Chloride: 107 mmol/L (ref 101–111)
Creatinine, Ser: 0.96 mg/dL (ref 0.61–1.24)
Glucose, Bld: 102 mg/dL — ABNORMAL HIGH (ref 65–99)
POTASSIUM: 3.8 mmol/L (ref 3.5–5.1)
Sodium: 140 mmol/L (ref 135–145)
TOTAL PROTEIN: 7.6 g/dL (ref 6.5–8.1)

## 2015-11-21 SURGERY — EGD (ESOPHAGOGASTRODUODENOSCOPY)
Anesthesia: General

## 2015-11-21 SURGERY — EGD (ESOPHAGOGASTRODUODENOSCOPY)
Anesthesia: General | Site: Mouth | Wound class: Dirty or Infected

## 2015-11-21 MED ORDER — SUCCINYLCHOLINE CHLORIDE 20 MG/ML IJ SOLN
INTRAMUSCULAR | Status: DC | PRN
  Administered 2015-11-21: 20 mg via INTRAVENOUS
  Administered 2015-11-21: 120 mg via INTRAVENOUS

## 2015-11-21 MED ORDER — DEXAMETHASONE SODIUM PHOSPHATE 10 MG/ML IJ SOLN
INTRAMUSCULAR | Status: DC | PRN
  Administered 2015-11-21: 10 mg via INTRAVENOUS

## 2015-11-21 MED ORDER — MIDAZOLAM HCL 2 MG/2ML IJ SOLN
INTRAMUSCULAR | Status: DC | PRN
  Administered 2015-11-21: 2 mg via INTRAVENOUS

## 2015-11-21 MED ORDER — LACTATED RINGERS IV SOLN
INTRAVENOUS | Status: DC | PRN
  Administered 2015-11-21: 01:00:00 via INTRAVENOUS

## 2015-11-21 MED ORDER — FENTANYL CITRATE (PF) 100 MCG/2ML IJ SOLN: 25.0000 ug | INTRAMUSCULAR | Status: DC | PRN

## 2015-11-21 MED ORDER — SUGAMMADEX SODIUM 200 MG/2ML IV SOLN
INTRAVENOUS | Status: DC | PRN
  Administered 2015-11-21: 235.8 mg via INTRAVENOUS

## 2015-11-21 MED ORDER — LIDOCAINE HCL (CARDIAC) 20 MG/ML IV SOLN
INTRAVENOUS | Status: DC | PRN
  Administered 2015-11-21: 100 mg via INTRAVENOUS

## 2015-11-21 MED ORDER — ROCURONIUM BROMIDE 100 MG/10ML IV SOLN
INTRAVENOUS | Status: DC | PRN
  Administered 2015-11-21 (×2): 20 mg via INTRAVENOUS
  Administered 2015-11-21: 10 mg via INTRAVENOUS

## 2015-11-21 MED ORDER — PROPOFOL 10 MG/ML IV BOLUS
INTRAVENOUS | Status: DC | PRN
  Administered 2015-11-21: 220 mg via INTRAVENOUS

## 2015-11-21 MED ORDER — ONDANSETRON HCL 4 MG/2ML IJ SOLN
4.0000 mg | Freq: Once | INTRAMUSCULAR | Status: DC | PRN
Start: 2015-11-21 — End: 2015-11-21

## 2015-11-21 MED ORDER — ONDANSETRON HCL 4 MG/2ML IJ SOLN
INTRAMUSCULAR | Status: DC | PRN
  Administered 2015-11-21: 4 mg via INTRAVENOUS

## 2015-11-21 MED ORDER — IOPAMIDOL (ISOVUE-300) INJECTION 61%: 75.0000 mL | Freq: Once | INTRAVENOUS | Status: DC | PRN

## 2015-11-21 MED ORDER — FENTANYL CITRATE (PF) 100 MCG/2ML IJ SOLN
INTRAMUSCULAR | Status: DC | PRN
  Administered 2015-11-21: 100 ug via INTRAVENOUS

## 2015-11-21 NOTE — Op Note (Signed)
[facility] Gastroenterology Patient Name: Rodney Callahan Procedure Date: 11/21/2015 12:29 AM MRN: [id number] Account #: [id number] Date of Birth: 06/13/1995 Admit Type: Outpatient Age: [age] [facility] Gender: Male Note Status: Finalized Procedure:            Upper GI endoscopy Indications:          Foreign body in the esophagus Providers:            Scot Junobert T. Chancy Claros, MD Medicines:            Propofol per Anesthesia Complications:        None Procedure:            Pre-Anesthesia Assessment:                       - After reviewing the risks and benefits, the patient                        was deemed in satisfactory condition to undergo the                        procedure.                       After obtaining informed consent, the endoscope was                        passed under direct vision. Throughout the procedure,                        the patient's blood pressure, pulse, and oxygen                        saturations were monitored continuously. The Endoscope                        was introduced through the mouth, and advanced to the                        second part of duodenum. The upper GI endoscopy was                        performed with difficulty due to presence of food. The                        patient tolerated the procedure well. Findings:      Food was found in the middle third of the esophagus. Removal of food was       accomplished. This took over an 52 minutes using a Raptor, snare, very       large raptor forceps. There was a lot of food in the esophagus and there       was an area of inflammation. A stricture was seen in mid distal       esophageal area.      LA Grade B (one or more mucosal breaks greater than 5 mm, not extending       between the tops of two mucosal folds) esophagitis with no bleeding was       found 37 cm from the incisors.      The entire examined stomach was normal.      The  duodenal bulb, first portion  of the duodenum and second portion of       the duodenum were normal. Impression:           - Food in the middle third of the esophagus. Removal                        was successful.                       - LA Grade B esophagitis.                       - Normal stomach.                       - Normal duodenal bulb, first portion of the duodenum                        and second portion of the duodenum. Recommendation:       Liquid diet, pudding, ice cream, yogurt for 3 days, may                        have scrambled eggs also.                       - The findings and recommendations were discussed with                        the patient's family. Scot Junobert T Braylei Totino, MD 11/21/2015 1:54:02 AM This report has been signed electronically. Number of Addenda: 0 Note Initiated On: 11/21/2015 12:29 AM      [facility]

## 2015-11-21 NOTE — Anesthesia Preprocedure Evaluation (Signed)
Anesthesia Evaluation  Patient identified by MRN, date of birth, ID band Patient awake    Reviewed: Allergy & Precautions, NPO status , Patient's Chart, lab work & pertinent test results  History of Anesthesia Complications Negative for: history of anesthetic complications  Airway Mallampati: I       Dental no notable dental hx. (+) Teeth Intact   Pulmonary neg pulmonary ROS,    Pulmonary exam normal        Cardiovascular Exercise Tolerance: Good negative cardio ROS   Rhythm:Regular     Neuro/Psych negative neurological ROS  negative psych ROS   GI/Hepatic negative GI ROS, Neg liver ROS,   Endo/Other  negative endocrine ROS  Renal/GU negative Renal ROS  negative genitourinary   Musculoskeletal   Abdominal Normal abdominal exam  (+)   Peds negative pediatric ROS (+)  Hematology negative hematology ROS (+)   Anesthesia Other Findings 20 yo M who was eating chicken and some "got stuck."  Otherwise healthy.  Last ate at 10:15.  Plan for RSI.  Reproductive/Obstetrics negative OB ROS                             Anesthesia Physical  Anesthesia Plan  ASA: I  Anesthesia Plan: General, Rapid Sequence and Cricoid Pressure   Post-op Pain Management:    Induction: Intravenous  Airway Management Planned: Nasal Cannula  Additional Equipment:   Intra-op Plan:   Post-operative Plan:   Informed Consent: I have reviewed the patients History and Physical, chart, labs and discussed the procedure including the risks, benefits and alternatives for the proposed anesthesia with the patient or authorized representative who has indicated his/her understanding and acceptance.     Plan Discussed with: CRNA, Anesthesiologist and Surgeon  Anesthesia Plan Comments:         Anesthesia Quick Evaluation

## 2015-11-21 NOTE — Anesthesia Postprocedure Evaluation (Signed)
Anesthesia Post Note  Patient: Rodney Callahan  Procedure(s) Performed: Procedure(s) (LRB): ESOPHAGOGASTRODUODENOSCOPY (EGD) (N/A)  Patient location during evaluation: PACU Anesthesia Type: General Level of consciousness: awake and alert Pain management: pain level controlled Vital Signs Assessment: post-procedure vital signs reviewed and stable Respiratory status: spontaneous breathing, nonlabored ventilation, respiratory function stable and patient connected to nasal cannula oxygen Cardiovascular status: blood pressure returned to baseline and stable Postop Assessment: no signs of nausea or vomiting Anesthetic complications: no    Last Vitals:  Filed Vitals:   11/21/15 0231 11/21/15 0236  BP: 147/75   Pulse: 94 72  Temp:    Resp: 14 16    Last Pain: There were no vitals filed for this visit.               Lenard SimmerAndrew Jaclin Finks

## 2015-11-21 NOTE — H&P (Signed)
Primary Care Physician:  Gardiner RhymeENDORFF,REBECCA, MD Primary Gastroenterologist:  Dr. Mechele CollinElliott  Pre-Procedure History & Physical: HPI:  Rodney Callahan is a 20 y.o. male is here for an endoscopy.  Due to foreign body (food) stuck in esophagus, not moved in response to swallowing carbonated beverages.   Past Medical History  Diagnosis Date  . Amblyopia of left eye   . Astigmatism   . DVD (dissociated vertical deviation)     Right Eye  . Exotropia   . Myopia   . Nystagmus   . Ptosis of left eyelid     Past Surgical History  Procedure Laterality Date  . Tonsillectomy    . Esophagogastroduodenoscopy (egd) with propofol N/A 01/03/2015    Procedure: ESOPHAGOGASTRODUODENOSCOPY (EGD) WITH PROPOFOL;  Surgeon: Scot Junobert T Dianely Krehbiel, MD;  [facility];  Service: Endoscopy;  Laterality: N/A;  . Eye surgery      eyelid surgery; frontalis suspension  . Eye surgery      strabismus eye surgery; bilat; lateral rectus recession  . Eye surgery      strabismus eye surgery; bilat; superior recuts recessions - OU; medial rectus resection - OS  . Eye surgery      strabismus eye surgery; Left; inferior oblique anteriorization w/dissection of scar tissue  . Esophagogastroduodenoscopy (egd) with propofol N/A 04/29/2015    Procedure: ESOPHAGOGASTRODUODENOSCOPY (EGD) WITH PROPOFOL;  Surgeon: Scot Junobert T Zanaria Morell, MD;  [facility];  Service: Endoscopy;  Laterality: N/A;  . Savory dilation N/A 04/29/2015    Procedure: SAVORY DILATION;  Surgeon: Scot Junobert T Olla Delancey, MD;  [facility];  Service: Endoscopy;  Laterality: N/A;    Prior to Admission medications   Not on File    Allergies as of 11/20/2015 - Review Complete 11/20/2015  Allergen Reaction Noted  . Peach flavor Hives 11/20/2015  . Other Swelling and Rash 11/20/2015    No family history on file.  Social History   Social History  . Marital Status: Single    Spouse Name: N/A  . Number of Children: N/A  .  Years of Education: N/A   Occupational History  . Not on file.   Social History Main Topics  . Smoking status: Never Smoker   . Smokeless tobacco: Never Used  . Alcohol Use: Yes  . Drug Use: No  . Sexual Activity: Not on file   Other Topics Concern  . Not on file   Social History Narrative    Review of Systems: See HPI, otherwise negative ROS.  Pt with previous esophageal obstructions from food bolus has same problem again and will need EGD under intubation and removal of food bolus.  Physical Exam: BP 146/81 mmHg  Pulse 100  Temp(Src) 98.5 F (36.9 C) (Oral)  Resp 18  Ht 6\' 8"  (2.032 m)  Wt 117.935 kg (260 lb)  BMI 28.56 kg/m2  SpO2 100% General:   Alert,  pleasant and cooperative in NAD Head:  Normocephalic and atraumatic. Neck:  Supple; no masses or thyromegaly. Lungs:  Clear throughout to auscultation.    Heart:  Regular rate and rhythm. Abdomen:  Soft, nontender and nondistended. Normal bowel sounds, without guarding, and without rebound.   Neurologic:  Alert and  oriented x4;  grossly normal neurologically.  Impression/Plan: Rodney Callahan is here for an endoscopy to be performed for removal of food bolus  Risks, benefits, limitations, and alternatives regarding  endoscopy have been reviewed with the patient.  Questions have been answered.  All parties agreeable.   Icy Fuhrmann,  MD  11/21/2015, 12:20 AM

## 2015-11-21 NOTE — Transfer of Care (Signed)
Immediate Anesthesia Transfer of Care Note  Patient: Rodney Callahan  Procedure(s) Performed: Procedure(s): ESOPHAGOGASTRODUODENOSCOPY (EGD) (N/A)  Patient Location: PACU  Anesthesia Type:General  Level of Consciousness: sedated  Airway & Oxygen Therapy: Patient Spontanous Breathing and Patient connected to face mask oxygen  Post-op Assessment: Report given to RN and Post -op Vital signs reviewed and stable  Post vital signs: Reviewed and stable  Last Vitals:  Filed Vitals:   11/21/15 0000 11/21/15 0201  BP: 146/81 111/59  Pulse: 100 90  Temp:  36.4 C  Resp: 18 18    Last Pain: There were no vitals filed for this visit.       Complications: No apparent anesthesia complications

## 2015-11-21 NOTE — Anesthesia Procedure Notes (Signed)
Procedure Name: Intubation Date/Time: 11/21/2015 12:55 AM Performed by: Junious SilkNOLES, Ericca Labra Pre-anesthesia Checklist: Patient identified, Patient being monitored, Timeout performed, Emergency Drugs available and Suction available Patient Re-evaluated:Patient Re-evaluated prior to inductionOxygen Delivery Method: Circle system utilized Preoxygenation: Pre-oxygenation with 100% oxygen Intubation Type: IV induction Ventilation: Mask ventilation without difficulty Laryngoscope Size: Mac and 4 Grade View: Grade I Tube type: Oral Tube size: 7.5 mm Number of attempts: 1 Airway Equipment and Method: Stylet Placement Confirmation: ETT inserted through vocal cords under direct vision,  positive ETCO2 and breath sounds checked- equal and bilateral Secured at: 21 cm Tube secured with: Tape Dental Injury: Teeth and Oropharynx as per pre-operative assessment

## 2015-11-22 ENCOUNTER — Encounter: Payer: Self-pay | Admitting: Unknown Physician Specialty

## 2015-12-13 ENCOUNTER — Encounter: Payer: Self-pay | Admitting: *Deleted

## 2016-02-10 ENCOUNTER — Encounter
Admission: RE | Disposition: A | Discharge status: Home / Self Care | Payer: Self-pay | Source: Ambulatory Visit | Attending: Unknown Physician Specialty

## 2016-02-10 ENCOUNTER — Ambulatory Visit
Admission: RE | Admit: 2016-02-10 | Discharge: 2016-02-10 | Disposition: A | Discharge status: Home / Self Care | Payer: 59 | Source: Ambulatory Visit | Attending: Unknown Physician Specialty | Admitting: Unknown Physician Specialty

## 2016-02-10 ENCOUNTER — Ambulatory Visit: Payer: 59 | Admitting: Anesthesiology

## 2016-02-10 DIAGNOSIS — K200 Eosinophilic esophagitis: Secondary | ICD-10-CM | POA: Diagnosis not present

## 2016-02-10 DIAGNOSIS — R1310 Dysphagia, unspecified: Secondary | ICD-10-CM | POA: Diagnosis present

## 2016-02-10 HISTORY — DX: Unspecified foreign body in esophagus causing other injury, initial encounter: T18.108A

## 2016-02-10 HISTORY — PX: ESOPHAGOGASTRODUODENOSCOPY (EGD) WITH PROPOFOL: SHX5813

## 2016-02-10 SURGERY — ESOPHAGOGASTRODUODENOSCOPY (EGD) WITH PROPOFOL
Anesthesia: General

## 2016-02-10 MED ORDER — LIDOCAINE HCL (PF) 2 % IJ SOLN
INTRAMUSCULAR | Status: DC | PRN
  Administered 2016-02-10: 100 mg

## 2016-02-10 MED ORDER — PROPOFOL 500 MG/50ML IV EMUL
INTRAVENOUS | Status: DC | PRN
  Administered 2016-02-10: 100 ug/kg/min via INTRAVENOUS

## 2016-02-10 MED ORDER — SODIUM CHLORIDE 0.9 % IV SOLN
INTRAVENOUS | Status: DC
  Administered 2016-02-10: 1000 mL via INTRAVENOUS

## 2016-02-10 MED ORDER — SODIUM CHLORIDE 0.9 % IV SOLN: INTRAVENOUS | Status: DC

## 2016-02-10 MED ORDER — BUTAMBEN-TETRACAINE-BENZOCAINE 2-2-14 % EX AERO
INHALATION_SPRAY | CUTANEOUS | Status: DC | PRN
  Administered 2016-02-10: 1 via TOPICAL

## 2016-02-10 MED ORDER — PROPOFOL 10 MG/ML IV BOLUS
INTRAVENOUS | Status: DC | PRN
  Administered 2016-02-10: 20 mg via INTRAVENOUS
  Administered 2016-02-10: 30 mg via INTRAVENOUS
  Administered 2016-02-10: 50 mg via INTRAVENOUS

## 2016-02-10 MED ORDER — MIDAZOLAM HCL 5 MG/5ML IJ SOLN
INTRAMUSCULAR | Status: DC | PRN
  Administered 2016-02-10: 2 mg via INTRAVENOUS

## 2016-02-10 MED ORDER — SODIUM CHLORIDE 0.9 % IV SOLN
INTRAVENOUS | Status: DC
  Administered 2016-02-10: 09:00:00 via INTRAVENOUS

## 2016-02-10 MED ORDER — FENTANYL CITRATE (PF) 100 MCG/2ML IJ SOLN
INTRAMUSCULAR | Status: DC | PRN
  Administered 2016-02-10 (×2): 50 ug via INTRAVENOUS

## 2016-02-10 MED ORDER — GLYCOPYRROLATE 0.2 MG/ML IJ SOLN
INTRAMUSCULAR | Status: DC | PRN
  Administered 2016-02-10: 0.2 mg via INTRAVENOUS

## 2016-02-10 NOTE — Op Note (Signed)
[facility] Patient Name: Rodney Callahan Procedure Date: 02/10/2016 9:01 AM MRN: [id number] Account #: [id number] Date of Birth: 1995/10/11 Admit Type: Outpatient Age: 20 Room: [facility] ENDO ROOM 4 Gender: Male Note Status: Finalized Procedure:            Upper GI endoscopy Indications:          Dysphagia Providers:            Scot Jun, MD Referring MD:         No Local Md, MD (Referring MD) Medicines:            Propofol per Anesthesia Complications:        No immediate complications. Procedure:            Pre-Anesthesia Assessment:                       - After reviewing the risks and benefits, the patient                        was deemed in satisfactory condition to undergo the                        procedure.                       After obtaining informed consent, the endoscope was                        passed under direct vision. Throughout the procedure,                        the patient's blood pressure, pulse, and oxygen                        saturations were monitored continuously. The Endoscope                        was introduced through the mouth, and advanced to the                        second part of duodenum. The upper GI endoscopy was                        somewhat difficult due to narrowing. The patient                        tolerated the procedure well. Findings:      Mucosal changes including ringed esophagus and feline appearance were       found in the upper third of the esophagus, in the middle third of the       esophagus and in the lower third of the esophagus. Biopsies were       obtained from the proximal and distal esophagus with cold forceps for       histology of suspected eosinophilic esophagitis. At the end of the       procedure I passed a guide wire into the stomach and a 71F dilator would       not pass so I passed a 96F Savary dilator with some resistance and then       went back down the esophagus  and the  post dilatation esophagus showed       mild dilatation.      The stomach was normal.      The examined duodenum was normal. Impression:           - Esophageal mucosal changes consistent with                        eosinophilic esophagitis. Biopsied.                       - Normal stomach.                       - Normal examined duodenum. Recommendation:       - Await pathology results.                       - The findings and recommendations were discussed with                        the patient's family. Take medicine. Scot Junobert T Everlina Gotts, MD 02/10/2016 9:25:10 AM This report has been signed electronically. Number of Addenda: 0 Note Initiated On: 02/10/2016 9:01 AM      [facility]

## 2016-02-10 NOTE — Anesthesia Postprocedure Evaluation (Signed)
Anesthesia Post Note  Patient: [name]  Procedure(s) Performed: Procedure(s) (LRB): ESOPHAGOGASTRODUODENOSCOPY (EGD) WITH PROPOFOL (N/A)  Patient location during evaluation: Endoscopy Anesthesia Type: General Level of consciousness: awake and alert and oriented Pain management: pain level controlled Vital Signs Assessment: post-procedure vital signs reviewed and stable Respiratory status: spontaneous breathing, nonlabored ventilation and respiratory function stable Cardiovascular status: blood pressure returned to baseline and stable Postop Assessment: no signs of nausea or vomiting Anesthetic complications: no    Last Vitals:  Vitals:   02/10/16 0930 02/10/16 0940  BP: (!) 111/49 (!) 109/48  Pulse:    Resp:    Temp:      Last Pain:  Vitals:   02/10/16 0920  TempSrc: Tympanic                 Damontre Millea

## 2016-02-10 NOTE — H&P (Signed)
Primary Care Physician:  Pcp Not In System Primary Gastroenterologist:  Dr. Mechele Collin  Pre-Procedure History & Physical: HPI:  Rodney Callahan is a 20 y.o. male is here for an endoscopy.  He has problems with food hanging up in esophagus and has had EGD with FB removal of the food.   Past Medical History:  Diagnosis Date  . Amblyopia of left eye   . Amblyopia of left eye   . Astigmatism   . Astigmatism   . DVD (dissociated vertical deviation)    Right Eye  . DVD (dissociated vertical deviation)   . Exotropia   . Exotropia   . Foreign body of esophagus   . Myopia   . Myopia   . Nystagmus   . Nystagmus   . Ptosis of left eyelid   . Ptosis of left eyelid     Past Surgical History:  Procedure Laterality Date  . dysphagia    . ESOPHAGOGASTRODUODENOSCOPY N/A 11/21/2015   Procedure: ESOPHAGOGASTRODUODENOSCOPY (EGD);  Surgeon: Scot Jun, MD;  Location: [facility] ENDOSCOPY;  Service: Endoscopy;  Laterality: N/A;  . ESOPHAGOGASTRODUODENOSCOPY (EGD) WITH PROPOFOL N/A 01/03/2015   Procedure: ESOPHAGOGASTRODUODENOSCOPY (EGD) WITH PROPOFOL;  Surgeon: Scot Jun, MD;  Location: [facility] ENDOSCOPY;  Service: Endoscopy;  Laterality: N/A;  . ESOPHAGOGASTRODUODENOSCOPY (EGD) WITH PROPOFOL N/A 04/29/2015   Procedure: ESOPHAGOGASTRODUODENOSCOPY (EGD) WITH PROPOFOL;  Surgeon: Scot Jun, MD;  Location: [facility] ENDOSCOPY;  Service: Endoscopy;  Laterality: N/A;  . EYE SURGERY     eyelid surgery; frontalis suspension  . EYE SURGERY     strabismus eye surgery; bilat; lateral rectus recession  . EYE SURGERY     strabismus eye surgery; bilat; superior recuts recessions - OU; medial rectus resection - OS  . EYE SURGERY     strabismus eye surgery; Left; inferior oblique anteriorization w/dissection of scar tissue  . FRONTALIS SUSPENSION    . SAVORY DILATION N/A 04/29/2015   Procedure: SAVORY DILATION;  Surgeon: Scot Jun, MD;  Location: [facility] ENDOSCOPY;  Service: Endoscopy;   Laterality: N/A;  . STRABISMUS SURGERY    . TONSILLECTOMY      Prior to Admission medications   Not on File    Allergies as of 11/27/2015 - Review Complete 11/21/2015  Allergen Reaction Noted  . Peach flavor Hives 11/20/2015  . Other Swelling and Rash 11/20/2015    History reviewed. No pertinent family history.  Social History   Social History  . Marital status: Single    Spouse name: N/A  . Number of children: N/A  . Years of education: N/A   Occupational History  . Not on file.   Social History Main Topics  . Smoking status: Never Smoker  . Smokeless tobacco: Never Used  . Alcohol use Yes  . Drug use: No  . Sexual activity: Not on file   Other Topics Concern  . Not on file   Social History Narrative  . No narrative on file    Review of Systems: See HPI, otherwise negative ROS  Physical Exam: BP 140/73   Pulse (!) 56   Temp 98.2 F (36.8 C) (Tympanic)   Resp 16   Ht 6\' 8"  (2.032 m)   Wt 131.5 kg (290 lb)   SpO2 100%   BMI 31.86 kg/m  General:   Alert,  pleasant and cooperative in NAD Head:  Normocephalic and atraumatic. Neck:  Supple; no masses or thyromegaly. Lungs:  Clear throughout to auscultation.    Heart:  Regular rate and  rhythm. Abdomen:  Soft, nontender and nondistended. Normal bowel sounds, without guarding, and without rebound.   Neurologic:  Alert and  oriented x4;  grossly normal neurologically.  Impression/Plan: Rodney Callahan is here for an endoscopy to be performed for dysphagia and esophageal stricture.  Risks, benefits, limitations, and alternatives regarding  endoscopy have been reviewed with the patient.  Questions have been answered.  All parties agreeable.   Lynnae PrudeELLIOTT, Orrin Yurkovich, MD  02/10/2016, 8:57 AM

## 2016-02-10 NOTE — Transfer of Care (Signed)
Immediate Anesthesia Transfer of Care Note  Patient: Rodney Callahan  Procedure(s) Performed: Procedure(s): ESOPHAGOGASTRODUODENOSCOPY (EGD) WITH PROPOFOL (N/A)  Patient Location: PACU  Anesthesia Type:General  Level of Consciousness: sedated  Airway & Oxygen Therapy: Patient Spontanous Breathing and Patient connected to nasal cannula oxygen  Post-op Assessment: Report given to RN and Post -op Vital signs reviewed and stable  Post vital signs: Reviewed and stable  Last Vitals:  Vitals:   02/10/16 0809  BP: 140/73  Pulse: (!) 56  Resp: 16  Temp: 36.8 C    Last Pain:  Vitals:   02/10/16 0809  TempSrc: Tympanic         Complications: No apparent anesthesia complications

## 2016-02-10 NOTE — Anesthesia Preprocedure Evaluation (Signed)
Anesthesia Evaluation  Patient identified by MRN, date of birth, ID band Patient awake    Reviewed: Allergy & Precautions, NPO status , Patient's Chart, lab work & pertinent test results  History of Anesthesia Complications Negative for: history of anesthetic complications  Airway Mallampati: I  TM Distance: >3 FB Neck ROM: Full    Dental no notable dental hx.    Pulmonary neg pulmonary ROS, neg sleep apnea, neg COPD,    breath sounds clear to auscultation- rhonchi (-) wheezing      Cardiovascular Exercise Tolerance: Good (-) hypertension(-) CAD and (-) Past MI  Rhythm:Regular Rate:Normal - Systolic murmurs and - Diastolic murmurs    Neuro/Psych negative neurological ROS  negative psych ROS   GI/Hepatic Neg liver ROS, Hx of dysphagia and esophageal strictures    Endo/Other  negative endocrine ROSneg diabetes  Renal/GU negative Renal ROS     Musculoskeletal negative musculoskeletal ROS (+)   Abdominal (+) - obese,   Peds  Hematology negative hematology ROS (+)   Anesthesia Other Findings   Reproductive/Obstetrics                             Anesthesia Physical Anesthesia Plan  ASA: I  Anesthesia Plan: General   Post-op Pain Management:    Induction: Intravenous  Airway Management Planned: Natural Airway  Additional Equipment:   Intra-op Plan:   Post-operative Plan:   Informed Consent: I have reviewed the patients History and Physical, chart, labs and discussed the procedure including the risks, benefits and alternatives for the proposed anesthesia with the patient or authorized representative who has indicated his/her understanding and acceptance.   Dental advisory given  Plan Discussed with: Anesthesiologist and CRNA  Anesthesia Plan Comments:         Anesthesia Quick Evaluation

## 2016-02-11 ENCOUNTER — Encounter: Payer: Self-pay | Admitting: Unknown Physician Specialty

## 2016-02-11 LAB — SURGICAL PATHOLOGY

## 2016-10-02 ENCOUNTER — Ambulatory Visit
Admission: EM | Admit: 2016-10-02 | Discharge: 2016-10-02 | Disposition: A | Discharge status: Home / Self Care | Payer: Commercial Managed Care - HMO | Attending: Family Medicine | Admitting: Family Medicine

## 2016-10-02 ENCOUNTER — Encounter: Payer: Self-pay | Admitting: *Deleted

## 2016-10-02 DIAGNOSIS — L03116 Cellulitis of left lower limb: Secondary | ICD-10-CM

## 2016-10-02 DIAGNOSIS — S80812A Abrasion, left lower leg, initial encounter: Secondary | ICD-10-CM

## 2016-10-02 MED ORDER — SULFAMETHOXAZOLE-TRIMETHOPRIM 800-160 MG PO TABS
1.0000 | ORAL_TABLET | Freq: Two times a day (BID) | ORAL | 0 refills | Status: DC
Start: 2016-10-02 — End: 2019-01-27

## 2016-10-02 NOTE — ED Triage Notes (Signed)
Patient scrapped his left lower leg and a lawn mower trailer hitch 5 days ago. An abrasion is present on the left lower leg anterior just above the ankle. Redness swelling and light drainage is visible.

## 2016-10-28 NOTE — ED Provider Notes (Signed)
[facility]    CSN: [id number] Arrival date & time: 10/02/16  1235     History   Chief Complaint Chief Complaint  Patient presents with  . Abrasion    HPI Rodney Callahan is a 21 y.o. male.   21 yo male with a c/o left lower leg pain, redness and warmth. Patient states he scraped his left lower leg on a lawn mower trailer hitch 5 days ago. An abrasion is present on the left lower leg anterior just above the ankle. Redness swelling and light drainage is visible   The history is provided by the patient.    Past Medical History:  Diagnosis Date  . Amblyopia of left eye   . Amblyopia of left eye   . Astigmatism   . Astigmatism   . DVD (dissociated vertical deviation)    Right Eye  . DVD (dissociated vertical deviation)   . Exotropia   . Exotropia   . Foreign body of esophagus   . Myopia   . Myopia   . Nystagmus   . Nystagmus   . Ptosis of left eyelid   . Ptosis of left eyelid     There are no active problems to display for this patient.   Past Surgical History:  Procedure Laterality Date  . dysphagia    . ESOPHAGOGASTRODUODENOSCOPY N/A 11/21/2015   Procedure: ESOPHAGOGASTRODUODENOSCOPY (EGD);  Surgeon: Scot Junobert T Elliott, MD;  [facility];  Service: Endoscopy;  Laterality: N/A;  . ESOPHAGOGASTRODUODENOSCOPY (EGD) WITH PROPOFOL N/A 01/03/2015   Procedure: ESOPHAGOGASTRODUODENOSCOPY (EGD) WITH PROPOFOL;  Surgeon: Scot Junobert T Elliott, MD;  [facility];  Service: Endoscopy;  Laterality: N/A;  . ESOPHAGOGASTRODUODENOSCOPY (EGD) WITH PROPOFOL N/A 04/29/2015   Procedure: ESOPHAGOGASTRODUODENOSCOPY (EGD) WITH PROPOFOL;  Surgeon: Scot Junobert T Elliott, MD;  [facility];  Service: Endoscopy;  Laterality: N/A;  . ESOPHAGOGASTRODUODENOSCOPY (EGD) WITH PROPOFOL N/A 02/10/2016   Procedure: ESOPHAGOGASTRODUODENOSCOPY (EGD) WITH PROPOFOL;  Surgeon: Scot Junobert T Elliott, MD;  [facility];  Service: Endoscopy;  Laterality:  N/A;  . EYE SURGERY     eyelid surgery; frontalis suspension  . EYE SURGERY     strabismus eye surgery; bilat; lateral rectus recession  . EYE SURGERY     strabismus eye surgery; bilat; superior recuts recessions - OU; medial rectus resection - OS  . EYE SURGERY     strabismus eye surgery; Left; inferior oblique anteriorization w/dissection of scar tissue  . FRONTALIS SUSPENSION    . SAVORY DILATION N/A 04/29/2015   Procedure: SAVORY DILATION;  Surgeon: Scot Junobert T Elliott, MD;  [facility];  Service: Endoscopy;  Laterality: N/A;  . STRABISMUS SURGERY    . TONSILLECTOMY         Home Medications    Prior to Admission medications   Medication Sig Start Date End Date Taking? Authorizing Provider  sulfamethoxazole-trimethoprim (BACTRIM DS,SEPTRA DS) 800-160 MG tablet Take 1 tablet by mouth 2 (two) times daily. 10/02/16   Payton Mccallumonty, Shali Vesey, MD    Family History History reviewed. No pertinent family history.  Social History Social History  Substance Use Topics  . Smoking status: Never Smoker  . Smokeless tobacco: Never Used  . Alcohol use Yes     Allergies   Acyclovir and related; Peach flavor; and Other   Review of Systems Review of Systems   Physical Exam Triage Vital Signs ED Triage Vitals  Enc Vitals Group     BP 10/02/16 1305 (!) 130/54     Pulse Rate 10/02/16 1305 Marland Kitchen(!)  59     Resp 10/02/16 1305 16     Temp 10/02/16 1305 98 F (36.7 C)     Temp src --      SpO2 10/02/16 1305 99 %     Weight 10/02/16 1309 280 lb (127 kg)     Height 10/02/16 1309 6\' 8"  (2.032 m)     Head Circumference --      Peak Flow --      Pain Score 10/02/16 1310 6     Pain Loc --      Pain Edu? --      Excl. in GC? --    No data found.   Updated Vital Signs BP (!) 130/54   Pulse (!) 59   Temp 98 F (36.7 C)   Resp 16   Ht 6\' 8"  (2.032 m)   Wt 280 lb (127 kg)   SpO2 99%   BMI 30.76 kg/m   Visual Acuity Right Eye Distance:   Left Eye Distance:   Bilateral  Distance:    Right Eye Near:   Left Eye Near:    Bilateral Near:     Physical Exam  Constitutional: He appears well-developed and well-nourished. No distress.  Skin: He is not diaphoretic.  Left lower leg with 4x6cm skin area of blanchable erythema, warmth and tenderness to palpation; along with superficial abrasion; minimal drainage  Nursing note and vitals reviewed.    UC Treatments / Results  Labs (all labs ordered are listed, but only abnormal results are displayed) Labs Reviewed - No data to display  EKG  EKG Interpretation None       Radiology No results found.  Procedures Procedures (including critical care time)  Medications Ordered in UC Medications - No data to display   Initial Impression / Assessment and Plan / UC Course  I have reviewed the triage vital signs and the nursing notes.  Pertinent labs & imaging results that were available during my care of the patient were reviewed by me and considered in my medical decision making (see chart for details).       Final Clinical Impressions(s) / UC Diagnoses   Final diagnoses:  Cellulitis of left leg  Abrasion, left lower leg, initial encounter    New Prescriptions Discharge Medication List as of 10/02/2016  1:38 PM    START taking these medications   Details  sulfamethoxazole-trimethoprim (BACTRIM DS,SEPTRA DS) 800-160 MG tablet Take 1 tablet by mouth 2 (two) times daily., Starting Fri 10/02/2016, Normal       1. diagnosis reviewed with patient 2. rx as per orders above; reviewed possible side effects, interactions, risks and benefits  3. Recommend supportive treatment with warm compresses to area 4. Follow-up prn if symptoms worsen or don't improve   Payton Mccallum, MD 10/28/16 1721

## 2017-04-27 ENCOUNTER — Emergency Department
Admission: EM | Admit: 2017-04-27 | Discharge: 2017-04-27 | Disposition: A | Discharge status: Home / Self Care | Payer: 59 | Attending: Emergency Medicine | Admitting: Emergency Medicine

## 2017-04-27 ENCOUNTER — Emergency Department: Payer: 59

## 2017-04-27 ENCOUNTER — Encounter: Payer: Self-pay | Admitting: Emergency Medicine

## 2017-04-27 DIAGNOSIS — Y999 Unspecified external cause status: Secondary | ICD-10-CM | POA: Insufficient documentation

## 2017-04-27 DIAGNOSIS — Y929 Unspecified place or not applicable: Secondary | ICD-10-CM | POA: Insufficient documentation

## 2017-04-27 DIAGNOSIS — R1310 Dysphagia, unspecified: Secondary | ICD-10-CM | POA: Diagnosis present

## 2017-04-27 DIAGNOSIS — T18128A Food in esophagus causing other injury, initial encounter: Secondary | ICD-10-CM | POA: Diagnosis not present

## 2017-04-27 DIAGNOSIS — X58XXXA Exposure to other specified factors, initial encounter: Secondary | ICD-10-CM | POA: Diagnosis not present

## 2017-04-27 DIAGNOSIS — Y939 Activity, unspecified: Secondary | ICD-10-CM | POA: Diagnosis not present

## 2017-04-27 MED ORDER — GLUCAGON HCL RDNA (DIAGNOSTIC) 1 MG IJ SOLR
INTRAMUSCULAR | Status: AC
  Filled 2017-04-27: qty 1

## 2017-04-27 MED ORDER — GLUCAGON HCL (RDNA) 1 MG IJ SOLR
1.0000 mg | Freq: Once | INTRAMUSCULAR | Status: DC
  Filled 2017-04-27: qty 1

## 2017-04-27 MED ORDER — SODIUM CHLORIDE 0.9 % IV SOLN: Freq: Once | INTRAVENOUS | Status: DC

## 2017-04-27 NOTE — ED Notes (Signed)
Pt states he has dislodged food from throat at this time, states he feels better at this time, throat is clear. MD aware

## 2017-04-27 NOTE — ED Triage Notes (Addendum)
Patient presents to the ED with difficulty swallowing after eating noodles approx. 1.5 hours ago.  Patient spitting into a bag.  Patient is able to speak clearly.  Patient is alert and nods appropriately to questions.  Patient's mother reports patient has had his esophagus stretched x 2 and has history of having food stuck in his throat.  Patient denies any difficulty breathing.

## 2017-04-27 NOTE — ED Notes (Addendum)
Pt states he was eating raman noodles and some became stuck in his throat aprox 2 hrs prior to arrival, pt hx of dysphagia. Pt speech clear, unable to swallow saliva per pt,. Denies any SOB

## 2017-04-27 NOTE — ED Provider Notes (Addendum)
[facility] Emergency Department Provider Note  ____________________________________________   I have reviewed the triage vital signs and the nursing notes. Where available I have reviewed prior notes and, if possible and indicated, outside hospital notes.    HISTORY  Chief Complaint Dysphagia    HPI Rodney Callahan is a 21 y.o. male with a history of esophageal food impaction with 2 separate disimpaction procedures, none recently.  He was eating noodles today and has a sensation of foreign body in his throat.  No trouble breathing, no significant pain, he states he is able to breathe with no difficulty but he is not able to swallow his secretions and feels that he is impacted again.  Happened a few hours before coming     Past Medical History:  Diagnosis Date  . Amblyopia of left eye   . Amblyopia of left eye   . Astigmatism   . Astigmatism   . DVD (dissociated vertical deviation)    Right Eye  . DVD (dissociated vertical deviation)   . Exotropia   . Exotropia   . Foreign body of esophagus   . Myopia   . Myopia   . Nystagmus   . Nystagmus   . Ptosis of left eyelid   . Ptosis of left eyelid     There are no active problems to display for this patient.   Past Surgical History:  Procedure Laterality Date  . dysphagia    . ESOPHAGOGASTRODUODENOSCOPY N/A 11/21/2015   Procedure: ESOPHAGOGASTRODUODENOSCOPY (EGD);  Surgeon: Scot Jun, MD;  Location: [facility] ENDOSCOPY;  Service: Endoscopy;  Laterality: N/A;  . ESOPHAGOGASTRODUODENOSCOPY (EGD) WITH PROPOFOL N/A 01/03/2015   Procedure: ESOPHAGOGASTRODUODENOSCOPY (EGD) WITH PROPOFOL;  Surgeon: Scot Jun, MD;  Location: [facility] ENDOSCOPY;  Service: Endoscopy;  Laterality: N/A;  . ESOPHAGOGASTRODUODENOSCOPY (EGD) WITH PROPOFOL N/A 04/29/2015   Procedure: ESOPHAGOGASTRODUODENOSCOPY (EGD) WITH PROPOFOL;  Surgeon: Scot Jun, MD;  Location: [facility] ENDOSCOPY;  Service: Endoscopy;   Laterality: N/A;  . ESOPHAGOGASTRODUODENOSCOPY (EGD) WITH PROPOFOL N/A 02/10/2016   Procedure: ESOPHAGOGASTRODUODENOSCOPY (EGD) WITH PROPOFOL;  Surgeon: Scot Jun, MD;  Location: [facility] ENDOSCOPY;  Service: Endoscopy;  Laterality: N/A;  . EYE SURGERY     eyelid surgery; frontalis suspension  . EYE SURGERY     strabismus eye surgery; bilat; lateral rectus recession  . EYE SURGERY     strabismus eye surgery; bilat; superior recuts recessions - OU; medial rectus resection - OS  . EYE SURGERY     strabismus eye surgery; Left; inferior oblique anteriorization w/dissection of scar tissue  . FRONTALIS SUSPENSION    . SAVORY DILATION N/A 04/29/2015   Procedure: SAVORY DILATION;  Surgeon: Scot Jun, MD;  Location: [facility] ENDOSCOPY;  Service: Endoscopy;  Laterality: N/A;  . STRABISMUS SURGERY    . TONSILLECTOMY      Prior to Admission medications   Medication Sig Start Date End Date Taking? Authorizing Provider  sulfamethoxazole-trimethoprim (BACTRIM DS,SEPTRA DS) 800-160 MG tablet Take 1 tablet by mouth 2 (two) times daily. 10/02/16   Payton Mccallum, MD    Allergies Acyclovir and related; Peach flavor; and Other  No family history on file.  Social History Social History   Tobacco Use  . Smoking status: Never Smoker  . Smokeless tobacco: Never Used  Substance Use Topics  . Alcohol use: Yes  . Drug use: No    Review of Systems Constitutional: No fever/chills Eyes: No visual changes. ENT: No sore throat. No stiff neck no neck pain Cardiovascular: Denies  chest pain. Respiratory: Denies shortness of breath. Gastrointestinal:   no vomiting.  No diarrhea.  No constipation. Genitourinary: Negative for dysuria. Musculoskeletal: Negative lower extremity swelling Skin: Negative for rash. Neurological: Negative for severe headaches, focal weakness or numbness.   ____________________________________________   PHYSICAL EXAM:  VITAL SIGNS: ED Triage Vitals  Enc Vitals Group      BP 04/27/17 1604 (!) 148/75     Pulse Rate 04/27/17 1604 85     Resp 04/27/17 1604 16     Temp 04/27/17 1604 98.4 F (36.9 C)     Temp Source 04/27/17 1604 Oral     SpO2 04/27/17 1604 100 %     Weight 04/27/17 1604 300 lb (136.1 kg)     Height 04/27/17 1604 6\' 6"  (1.981 m)     Head Circumference --      Peak Flow --      Pain Score 04/27/17 1603 3     Pain Loc --      Pain Edu? --      Excl. in GC? --     Constitutional: Alert and oriented. Well appearing and in no acute distress. Eyes: Conjunctivae are normal Head: Atraumatic HEENT: No congestion/rhinnorhea. Mucous membranes are moist.  Oropharynx non-erythematous Neck:   Slight tenderness palpation of the trapezius muscle on the right with no meningismus, no masses, no stridor Cardiovascular: Normal rate, regular rhythm. Grossly normal heart sounds.  Good peripheral circulation. Respiratory: Normal respiratory effort.  No retractions. Lungs CTAB. Abdominal: Soft and nontender. No distention. No guarding no rebound Neurologic:  Normal speech and language. No gross focal neurologic deficits are appreciated.  Patient does have baseline left-sided eye droop which is not changed from prior Skin:  Skin is warm, dry and intact. No rash noted. Psychiatric: Mood and affect are normal. Speech and behavior are normal.  ____________________________________________   LABS (all labs ordered are listed, but only abnormal results are displayed)  Labs Reviewed - No data to display  Pertinent labs  results that were available during my care of the patient were reviewed by me and considered in my medical decision making (see chart for details). ____________________________________________  EKG  I personally interpreted any EKGs ordered by me or triage  ____________________________________________  RADIOLOGY  Pertinent labs & imaging results that were available during my care of the patient were reviewed by me and considered in my  medical decision making (see chart for details). If possible, patient and/or family made aware of any abnormal findings.  Dg Chest 2 View  Result Date: 04/27/2017 CLINICAL DATA:  Difficulty swallowing, feels like food gets. , previous history of esophageal dilatation EXAM: CHEST  2 VIEW COMPARISON:  None. FINDINGS: No active infiltrate or effusion is seen. Mediastinal and hilar contours are unremarkable. The heart is within normal limits in size. No bony abnormality is seen. No dilatation of the esophagus is evident. IMPRESSION: No active cardiopulmonary disease. Electronically Signed   By: Dwyane DeePaul  Barry M.D.   On: 04/27/2017 16:57   ____________________________________________    PROCEDURES  Procedure(s) performed: None  Procedures  Critical Care performed: None  ____________________________________________   INITIAL IMPRESSION / ASSESSMENT AND PLAN / ED COURSE  Pertinent labs & imaging results that were available during my care of the patient were reviewed by me and considered in my medical decision making (see chart for details).  Likely, patient has a food impaction I did discuss with Dr. Norma Fredricksonoledo who will come see the patient we are starting him on IV  fluid, patient is in no acute distress he simply is not able to swallow secretions but he is spitting up with no difficulty, x-ray is negative for anything reasonably Bor[place], and I do not think a soft tissue neck will reveal anything if noodles were the culprit.  I will give glucagon in the faint hope that it may do something pending definitive GI care.  ----------------------------------------- 5:44 PM on 04/27/2017 -----------------------------------------  Glucagon to be given patient felt the impaction pass and he is able to swallow with no difficulty has no ongoing pain no evidence of Boerhaave's or other complications.  He is able to tolerate liquids and secretions with no difficulty we will keep him on soft liquid diet until he  can see GI which she will see tomorrow.  He is very satisfied with this and would like to go home.    ____________________________________________   FINAL CLINICAL IMPRESSION(S) / ED DIAGNOSES  Final diagnoses:  None      This chart was dictated using voice recognition software.  Despite best efforts to proofread,  errors can occur which can change meaning.      Jeanmarie PlantMcShane, Jalicia Roszak A, MD 04/27/17 1711    Jeanmarie PlantMcShane, Diani Jillson A, MD 04/27/17 [phone]

## 2017-04-27 NOTE — ED Notes (Signed)
Patient and family verbalized understanding of discharge instructions and follow-up care. Patient ambulatory to lobby with NAD noted.

## 2017-04-27 NOTE — ED Notes (Signed)
Patient able to drink water with no problems. No nausea, vomiting or dysphagia noted at this time. MD made aware.

## 2017-06-21 ENCOUNTER — Other Ambulatory Visit: Payer: Self-pay

## 2017-06-21 ENCOUNTER — Ambulatory Visit
Admission: EM | Admit: 2017-06-21 | Discharge: 2017-06-21 | Disposition: A | Discharge status: Home / Self Care | Payer: 59 | Attending: Family Medicine | Admitting: Family Medicine

## 2017-06-21 ENCOUNTER — Encounter: Payer: Self-pay | Admitting: Emergency Medicine

## 2017-06-21 DIAGNOSIS — A084 Viral intestinal infection, unspecified: Secondary | ICD-10-CM

## 2017-06-21 MED ORDER — ONDANSETRON 8 MG PO TBDP: 8.0000 mg | ORAL_TABLET | Freq: Three times a day (TID) | ORAL | 0 refills | Status: DC | PRN

## 2017-06-21 NOTE — ED Provider Notes (Signed)
[facility]    CSN: [id number] Arrival date & time: 06/21/17  1814     History   Chief Complaint Chief Complaint  Patient presents with  . Headache    HPI Archibald Marchetta is a 22 y.o. male.   22 yo male with a c/o nausea and watery diarrhea for 2 days; vomited only once. Today has had a headache all over his head. Denies any fevers, chills, melena, hematochezia.    The history is provided by the patient.  Headache    Past Medical History:  Diagnosis Date  . Amblyopia of left eye   . Amblyopia of left eye   . Astigmatism   . Astigmatism   . DVD (dissociated vertical deviation)    Right Eye  . DVD (dissociated vertical deviation)   . Exotropia   . Exotropia   . Foreign body of esophagus   . Myopia   . Myopia   . Nystagmus   . Nystagmus   . Ptosis of left eyelid   . Ptosis of left eyelid     There are no active problems to display for this patient.   Past Surgical History:  Procedure Laterality Date  . dysphagia    . ESOPHAGOGASTRODUODENOSCOPY N/A 11/21/2015   Procedure: ESOPHAGOGASTRODUODENOSCOPY (EGD);  Surgeon: Scot Jun, MD;  Location: [facility] ENDOSCOPY;  Service: Endoscopy;  Laterality: N/A;  . ESOPHAGOGASTRODUODENOSCOPY (EGD) WITH PROPOFOL N/A 01/03/2015   Procedure: ESOPHAGOGASTRODUODENOSCOPY (EGD) WITH PROPOFOL;  Surgeon: Scot Jun, MD;  Location: [facility] ENDOSCOPY;  Service: Endoscopy;  Laterality: N/A;  . ESOPHAGOGASTRODUODENOSCOPY (EGD) WITH PROPOFOL N/A 04/29/2015   Procedure: ESOPHAGOGASTRODUODENOSCOPY (EGD) WITH PROPOFOL;  Surgeon: Scot Jun, MD;  Location: [facility] ENDOSCOPY;  Service: Endoscopy;  Laterality: N/A;  . ESOPHAGOGASTRODUODENOSCOPY (EGD) WITH PROPOFOL N/A 02/10/2016   Procedure: ESOPHAGOGASTRODUODENOSCOPY (EGD) WITH PROPOFOL;  Surgeon: Scot Jun, MD;  Location: [facility] ENDOSCOPY;  Service: Endoscopy;  Laterality: N/A;  . EYE SURGERY     eyelid surgery; frontalis suspension  . EYE SURGERY     strabismus eye surgery; bilat; lateral rectus recession  . EYE SURGERY     strabismus eye surgery; bilat; superior recuts recessions - OU; medial rectus resection - OS  . EYE SURGERY     strabismus eye surgery; Left; inferior oblique anteriorization w/dissection of scar tissue  . FRONTALIS SUSPENSION    . SAVORY DILATION N/A 04/29/2015   Procedure: SAVORY DILATION;  Surgeon: Scot Jun, MD;  Location: [facility] ENDOSCOPY;  Service: Endoscopy;  Laterality: N/A;  . STRABISMUS SURGERY    . TONSILLECTOMY         Home Medications    Prior to Admission medications   Medication Sig Start Date End Date Taking? Authorizing Provider  omeprazole (PRILOSEC) 40 MG capsule Take 40 mg by mouth daily.   Yes [provider]  ondansetron (ZOFRAN ODT) 8 MG disintegrating tablet Take 1 tablet (8 mg total) by mouth every 8 (eight) hours as needed. 06/21/17   Payton Mccallum, MD  sulfamethoxazole-trimethoprim (BACTRIM DS,SEPTRA DS) 800-160 MG tablet Take 1 tablet by mouth 2 (two) times daily. 10/02/16   Payton Mccallum, MD    Family History Family History  Problem Relation Age of Onset  . ADD / ADHD Mother   . Osteoarthritis Father     Social History Social History   Tobacco Use  . Smoking status: Never Smoker  . Smokeless tobacco: Never Used  Substance Use Topics  . Alcohol use: Yes    Comment: socially  .  Drug use: No     Allergies   Acyclovir and related; Peach flavor; and Other   Review of Systems Review of Systems  Neurological: Positive for headaches.     Physical Exam Triage Vital Signs ED Triage Vitals  Enc Vitals Group     BP 06/21/17 1852 134/76     Pulse Rate 06/21/17 1852 72     Resp 06/21/17 1852 16     Temp 06/21/17 1852 98.2 F (36.8 C)     Temp Source 06/21/17 1852 Oral     SpO2 06/21/17 1852 99 %     Weight 06/21/17 1851 300 lb (136.1 kg)     Height 06/21/17 1851 6\' 7"  (2.007 m)     Head Circumference --      Peak Flow --      Pain Score 06/21/17  1852 9     Pain Loc --      Pain Edu? --      Excl. in GC? --    No data found.  Updated Vital Signs BP 134/76 (BP Location: Left Arm)   Pulse 72   Temp 98.2 F (36.8 C) (Oral)   Resp 16   Ht 6\' 7"  (2.007 m)   Wt 300 lb (136.1 kg)   SpO2 99%   BMI 33.80 kg/m   Visual Acuity Right Eye Distance:   Left Eye Distance:   Bilateral Distance:    Right Eye Near:   Left Eye Near:    Bilateral Near:     Physical Exam  Constitutional: He is oriented to person, place, and time. He appears well-developed and well-nourished. No distress.  HENT:  Head: Normocephalic and atraumatic.  Cardiovascular: Normal rate, regular rhythm, normal heart sounds and intact distal pulses.  No murmur heard. Pulmonary/Chest: Effort normal and breath sounds normal. No respiratory distress. He has no wheezes. He has no rales.  Abdominal: Soft. Bowel sounds are normal. He exhibits no distension and no mass. There is no tenderness. There is no rebound and no guarding.  Neurological: He is alert and oriented to person, place, and time. He displays normal reflexes. No cranial nerve deficit or sensory deficit. He exhibits normal muscle tone. Coordination normal.  Skin: No rash noted. He is not diaphoretic.  Nursing note and vitals reviewed.    UC Treatments / Results  Labs (all labs ordered are listed, but only abnormal results are displayed) Labs Reviewed - No data to display  EKG  EKG Interpretation None       Radiology No results found.  Procedures Procedures (including critical care time)  Medications Ordered in UC Medications - No data to display   Initial Impression / Assessment and Plan / UC Course  I have reviewed the triage vital signs and the nursing notes.  Pertinent labs & imaging results that were available during my care of the patient were reviewed by me and considered in my medical decision making (see chart for details).       Final Clinical Impressions(s) / UC  Diagnoses   Final diagnoses:  Viral gastroenteritis    ED Discharge Orders        Ordered    ondansetron (ZOFRAN ODT) 8 MG disintegrating tablet  Every 8 hours PRN     06/21/17 1910     1.  diagnosis reviewed with patient 2. rx as per orders above; reviewed possible side effects, interactions, risks and benefits  3. Recommend supportive treatment with clear liquids then advance slowly as tolerated;  otc imodium ad prn 4. Follow-up prn if symptoms worsen or don't improve  Controlled Substance Prescriptions [place] Controlled Substance Registry consulted? Not Applicable   Payton Mccallumonty, Eathel Pajak, MD 06/21/17 [phone]

## 2017-06-21 NOTE — Discharge Instructions (Signed)
Imodium AD over the counter as needed

## 2017-06-21 NOTE — ED Triage Notes (Signed)
Patient in tonight c/o headache since last night. Emesis x 1 yesterday and diarrhea all day. Patient denies fever. Patient has used OTC Tylenol for the headache without relief.

## 2019-01-27 ENCOUNTER — Other Ambulatory Visit: Payer: Self-pay

## 2019-01-27 ENCOUNTER — Encounter: Payer: Self-pay | Admitting: Emergency Medicine

## 2019-01-27 ENCOUNTER — Ambulatory Visit
Admission: EM | Admit: 2019-01-27 | Discharge: 2019-01-27 | Disposition: A | Discharge status: Home / Self Care | Payer: 59 | Attending: Emergency Medicine | Admitting: Emergency Medicine

## 2019-01-27 DIAGNOSIS — Z20822 Contact with and (suspected) exposure to covid-19: Secondary | ICD-10-CM

## 2019-01-27 DIAGNOSIS — R509 Fever, unspecified: Secondary | ICD-10-CM | POA: Diagnosis not present

## 2019-01-27 DIAGNOSIS — R05 Cough: Secondary | ICD-10-CM | POA: Diagnosis not present

## 2019-01-27 DIAGNOSIS — R6889 Other general symptoms and signs: Secondary | ICD-10-CM

## 2019-01-27 DIAGNOSIS — B9789 Other viral agents as the cause of diseases classified elsewhere: Secondary | ICD-10-CM

## 2019-01-27 DIAGNOSIS — R062 Wheezing: Secondary | ICD-10-CM

## 2019-01-27 DIAGNOSIS — R197 Diarrhea, unspecified: Secondary | ICD-10-CM

## 2019-01-27 DIAGNOSIS — J988 Other specified respiratory disorders: Secondary | ICD-10-CM

## 2019-01-27 MED ORDER — ALBUTEROL SULFATE HFA 108 (90 BASE) MCG/ACT IN AERS: 1.0000 | INHALATION_SPRAY | Freq: Four times a day (QID) | RESPIRATORY_TRACT | 0 refills | Status: DC | PRN

## 2019-01-27 MED ORDER — FLUTICASONE PROPIONATE 50 MCG/ACT NA SUSP: 2.0000 | Freq: Every day | NASAL | 0 refills | Status: DC

## 2019-01-27 MED ORDER — IBUPROFEN 600 MG PO TABS: 600.0000 mg | ORAL_TABLET | Freq: Four times a day (QID) | ORAL | 0 refills | Status: DC | PRN

## 2019-01-27 MED ORDER — AEROCHAMBER PLUS MISC: 2 refills | Status: DC

## 2019-01-27 NOTE — ED Provider Notes (Signed)
HPI  SUBJECTIVE:  Rodney Callahan is a 23 y.o. male who presents with feeling feverish with nasal congestion, rhinorrhea, postnasal drip, mild sore throat, chest congestion, cough productive of brownish phlegm, wheezing, diarrhea starting yesterday.  States that his sister who lives with him tested positive for COVID recently.  No documented fevers above 100.4.  No chest pain, shortness of breath.  No sinus pain or pressure, body aches, headaches, abdominal pain, nausea, vomiting.  He took DayQuil within 4 to 6 hours of evaluation.  No allergy symptoms.  He tried DayQuil and NyQuil with some improvement in symptoms.  No aggravating factors.  Past medical history negative for asthma, emphysema, COPD, smoking, diabetes, hypertension, frequent sinus infections.  PMD: None.    Past Medical History:  Diagnosis Date  . Amblyopia of left eye   . Amblyopia of left eye   . Astigmatism   . Astigmatism   . DVD (dissociated vertical deviation)    Right Eye  . DVD (dissociated vertical deviation)   . Exotropia   . Exotropia   . Foreign body of esophagus   . Myopia   . Myopia   . Nystagmus   . Nystagmus   . Ptosis of left eyelid   . Ptosis of left eyelid     Past Surgical History:  Procedure Laterality Date  . dysphagia    . ESOPHAGOGASTRODUODENOSCOPY N/A 11/21/2015   Procedure: ESOPHAGOGASTRODUODENOSCOPY (EGD);  Surgeon: Scot Junobert T Elliott, MD;  Location: [facility] ENDOSCOPY;  Service: Endoscopy;  Laterality: N/A;  . ESOPHAGOGASTRODUODENOSCOPY (EGD) WITH PROPOFOL N/A 01/03/2015   Procedure: ESOPHAGOGASTRODUODENOSCOPY (EGD) WITH PROPOFOL;  Surgeon: Scot Junobert T Elliott, MD;  [facility]COPY;  Service: Endoscopy;  Laterality: N/A;  . ESOPHAGOGASTRODUODENOSCOPY (EGD) WITH PROPOFOL N/A 04/29/2015   Procedure: ESOPHAGOGASTRODUODENOSCOPY (EGD) WITH PROPOFOL;  Surgeon: Scot Junobert T Elliott, MD;  [facility];  Service: Endoscopy;  Laterality: N/A;  . ESOPHAGOGASTRODUODENOSCOPY (EGD)  WITH PROPOFOL N/A 02/10/2016   Procedure: ESOPHAGOGASTRODUODENOSCOPY (EGD) WITH PROPOFOL;  Surgeon: Scot Junobert T Elliott, MD;  [facility];  Service: Endoscopy;  Laterality: N/A;  . EYE SURGERY     eyelid surgery; frontalis suspension  . EYE SURGERY     strabismus eye surgery; bilat; lateral rectus recession  . EYE SURGERY     strabismus eye surgery; bilat; superior recuts recessions - OU; medial rectus resection - OS  . EYE SURGERY     strabismus eye surgery; Left; inferior oblique anteriorization w/dissection of scar tissue  . FRONTALIS SUSPENSION    . SAVORY DILATION N/A 04/29/2015   Procedure: SAVORY DILATION;  Surgeon: Scot Junobert T Elliott, MD;  [facility];  Service: Endoscopy;  Laterality: N/A;  . STRABISMUS SURGERY    . TONSILLECTOMY      Family History  Problem Relation Age of Onset  . ADD / ADHD Mother   . Osteoarthritis Father     Social History   Tobacco Use  . Smoking status: Never Smoker  . Smokeless tobacco: Never Used  Substance Use Topics  . Alcohol use: Yes    Comment: socially  . Drug use: No    No current facility-administered medications for this encounter.   Current Outpatient Medications:  .  albuterol (VENTOLIN HFA) 108 (90 Base) MCG/ACT inhaler, Inhale 1-2 puffs into the lungs every 6 (six) hours as needed for wheezing or shortness of breath., Disp: 18 g, Rfl: 0 .  fluticasone (FLONASE) 50 MCG/ACT nasal spray, Place 2 sprays into both nostrils daily., Disp: 16 g, Rfl: 0 .  ibuprofen (ADVIL) 600 MG tablet, Take 1 tablet (600 mg total) by mouth every 6 (six) hours as needed., Disp: 30 tablet, Rfl: 0 .  Spacer/Aero-Holding Chambers (AEROCHAMBER PLUS) inhaler, Use as instructed, Disp: 1 each, Rfl: 2  Allergies  Allergen Reactions  . Acyclovir And Related   . Peach Flavor Hives  . Other Swelling and Rash    Reactive agents: Jim's Barbeque Sauce & Lemon Pepper     ROS  As noted in HPI.   Physical Exam  BP (!) 141/86 (BP  Location: Left Arm)   Pulse (!) 112   Temp 98.3 F (36.8 C) (Oral)   Resp 18   Ht 6\' 7"  (2.007 m)   Wt (!) 140.6 kg   SpO2 96%   BMI 34.92 kg/m   Constitutional: Well developed, well nourished, no acute distress Eyes:  EOMI, conjunctiva normal bilaterally HENT: Normocephalic, atraumatic,mucus membranes moist.  Erythematous, swollen turbinates with mucoid nasal discharge.  No maxillary, frontal sinus tenderness.  Normal oropharynx.  Normal tonsils without exudates, uvula midline.  Positive cobblestoning and postnasal drip. Neck: No anterior, posterior cervical lymphadenopathy Respiratory: Normal inspiratory effort, good air movement, diffuse wheezing throughout all lung fields.  No rales or rhonchi Cardiovascular: Regular tachycardia, no murmurs rubs or gallops GI: nondistended skin: No rash, skin intact Musculoskeletal: no deformities Neurologic: Alert & oriented x 3, no focal neuro deficits Psychiatric: Speech and behavior appropriate   ED Course   Medications - No data to display  Orders Placed This Encounter  Procedures  . Novel Coronavirus, NAA (hospital order; send-out to ref lab)    Standing Status:   Standing    Number of Occurrences:   1    Order Specific Question:   Is this test for diagnosis or screening    Answer:   Diagnosis of ill patient    Order Specific Question:   Symptomatic for COVID-19 as defined by CDC    Answer:   Yes    Order Specific Question:   Date of Symptom Onset    Answer:   01/26/2019    Order Specific Question:   Hospitalized for COVID-19    Answer:   No    Order Specific Question:   Admitted to ICU for COVID-19    Answer:   No    Order Specific Question:   Previously tested for COVID-19    Answer:   No    Order Specific Question:   Resident in a congregate (group) care setting    Answer:   No    Order Specific Question:   Employed in healthcare setting    Answer:   No    No results found for this or any previous visit (from the past  24 hour(s)). No results found.  ED Clinical Impression  1. Suspected Covid-19 Virus Infection   2. Viral respiratory infection      ED Assessment/Plan  Suspect COVID given close contact who tested positive for COVID versus viral respiratory infection. COVID test sent.  No clear indications for antibiotics.  He has no sinus tenderness.  Home with Flonase, saline nasal irrigation, Mucinex D, discontinue DayQuil, NyQuil, 600 mg ibuprofen combined with 1 g of Tylenol 3-4 times a day as needed.  Will send home with an albuterol inhaler and a spacer for the wheezing.  COVID work note written.  Follow-up with a primary care physician of his choice or may return here in 5 to 7 days if not feeling any better, consider doing chest  x-ray at that time to rule out pneumonia.  Gave him strict ER return precautions.   Discussed labs, MDM, treatment plan, and plan for follow-up with patient. Discussed sn/sx that should prompt return to the ED. patient agrees with plan.   Meds ordered this encounter  Medications  . fluticasone (FLONASE) 50 MCG/ACT nasal spray    Sig: Place 2 sprays into both nostrils daily.    Dispense:  16 g    Refill:  0  . albuterol (VENTOLIN HFA) 108 (90 Base) MCG/ACT inhaler    Sig: Inhale 1-2 puffs into the lungs every 6 (six) hours as needed for wheezing or shortness of breath.    Dispense:  18 g    Refill:  0  . Spacer/Aero-Holding Chambers (AEROCHAMBER PLUS) inhaler    Sig: Use as instructed    Dispense:  1 each    Refill:  2  . ibuprofen (ADVIL) 600 MG tablet    Sig: Take 1 tablet (600 mg total) by mouth every 6 (six) hours as needed.    Dispense:  30 tablet    Refill:  0    *This clinic note was created using [employer]. Therefore, there may be occasional mistakes despite careful proofreading.   ?    Melynda Ripple, MD 01/27/19 1740

## 2019-01-27 NOTE — ED Triage Notes (Signed)
Patient in today c/o subjective fever, cough and nasal congestion x yesterday. Patient states he missed work yesterday and was taking Nyquil/Dayquil without relief.

## 2019-01-27 NOTE — Discharge Instructions (Signed)
Flonase, saline nasal irrigation with a Milta Deiters med rinse and distilled water as often as you want, Mucinex D., 600 mg ibuprofen combined with 1 g of Tylenol 3-4 times a day as needed. discontinue DayQuil, NyQuil,.  1 to 2 puffs from your albuterol inhaler 3 4 to 6 hours and a spacer for the wheezing.  Follow-up with a primary care physician of your choice or may return here in 5 to 7 days if not feeling any better,  Here is a list of primary care providers who are taking new patients:  Dr. Otilio Miu, Dr. Adline Potter [address] [place] [address] [phone]  [place] [address]  [phone]  [facility] [address] [place], [place] [address] [phone]  [facility]  [phone] [place], [place] [address]  Here are clinics/ other resources who will see you if you do not have insurance. Some have certain criteria that you must meet. Call them and find out what they are:  [facility]: [address], [place], [place] [address] Phone: [phone] Hours: First and Third Saturdays of each Month, 9 a.m. - 1 p.m.  [facility]: [address], [place], [place], [place] [address] Phone: [phone] Hours: Tuesday, 4 p.m. - 8 p.m. Thursday, 1 p.m. - 8 p.m. Wednesday, 9 a.m. - [facility] [address] [place], [place] [address] Phone: [phone] Pharmacy Phone Number: [phone] Dental Phone Number: [phone] [place] Help: [phone]  Dental Hours: Monday - Thursday, 8 a.m. - 6 p.m.  [facility] [address], [place], [place] [address] Phone: [phone] Pharmacy Phone Number: [phone] [facility] Insurance Help: [phone]  [facility] [place], [place] [address] Phone: [phone] Pharmacy Phone Number: [phone] [facility] Insurance Help:  [phone]  [facility] [address] [place], [place] [address] Phone: [phone] [facility] Insurance Help: [phone]   [address] [place], [place] [address] Phone: [phone]  Go to www.goodrx.com to look up your medications. This will give you a list of where you can find your prescriptions at the most affordable prices. Or ask the pharmacist what the cash price is, or if they have any other discount programs available to help make your medication more affordable. This can be less expensive than what you would pay with insurance.

## 2019-01-27 NOTE — ED Triage Notes (Signed)
Patient states that his little sister was tested for COVID19 twice on 01/25/19 and sister was positive for one and negative for the other. Sister  was positive on the test that she swabbed herself and negative on the test that the nurse swabbed her.

## 2019-01-28 LAB — NOVEL CORONAVIRUS, NAA (HOSP ORDER, SEND-OUT TO REF LAB; TAT 18-24 HRS): SARS-CoV-2, NAA: NOT DETECTED

## 2019-06-22 ENCOUNTER — Ambulatory Visit
Admission: EM | Admit: 2019-06-22 | Discharge: 2019-06-22 | Disposition: A | Discharge status: Home / Self Care | Payer: BC Managed Care – PPO | Attending: Family Medicine | Admitting: Family Medicine

## 2019-06-22 ENCOUNTER — Other Ambulatory Visit: Payer: Self-pay

## 2019-06-22 DIAGNOSIS — Z20822 Contact with and (suspected) exposure to covid-19: Secondary | ICD-10-CM

## 2019-06-22 DIAGNOSIS — J029 Acute pharyngitis, unspecified: Secondary | ICD-10-CM

## 2019-06-22 DIAGNOSIS — R05 Cough: Secondary | ICD-10-CM

## 2019-06-22 DIAGNOSIS — U071 COVID-19: Secondary | ICD-10-CM | POA: Insufficient documentation

## 2019-06-22 DIAGNOSIS — R509 Fever, unspecified: Secondary | ICD-10-CM

## 2019-06-22 DIAGNOSIS — J069 Acute upper respiratory infection, unspecified: Secondary | ICD-10-CM | POA: Diagnosis present

## 2019-06-22 DIAGNOSIS — Z7189 Other specified counseling: Secondary | ICD-10-CM | POA: Diagnosis present

## 2019-06-22 LAB — GROUP A STREP BY PCR: Group A Strep by PCR: NOT DETECTED

## 2019-06-22 NOTE — ED Triage Notes (Signed)
Pt presents with c/o sore throat, cough and mild shob that started Tuesday. He reports possible fever yesterday. He denies chills, n/v/d, nasal congestion or other symptoms. He does report his mom and dad are also sick. He denies any known COVID exposure. He reports he does get strep throat several times a year.

## 2019-06-22 NOTE — Discharge Instructions (Addendum)
It was very nice seeing you today in clinic. Thank you for entrusting me with your care.  ° °Rest and stay HYDRATED. Water and electrolyte containing beverages (Gatorade, Pedialyte) are best to prevent dehydration and electrolyte abnormalities.  ° °Recommend warm salt water gargles, hard candies/lozenges, and hot tea with honey/lemon to help soothe the throat and reduce irritation.  ° °May use Tylenol and/or Ibuprofen as needed for pain/fever.  ° °You were tested for SARS-CoV-2 (novel coronavirus) today. Testing is performed by an outside lab ([facility]) and has variable turn around times ranging between 24-48 hours. Current recommendations from the the CDC and [place] DHHS require that you remain out of work in order to quarantine at home until negative test results are have been received. In the event that your test results are positive, you will be contacted with further directives. These measures are being implemented out of an abundance of caution to prevent transmission and spread during the current SARS-CoV-2 pandemic. ° °Make arrangements to follow up with your regular doctor in 1 week for re-evaluation if not improving. If your symptoms/condition worsens, please seek follow up care either here or in the ER. Please remember, our [place] providers are "right here with you" when you need us.  ° °Again, it was my pleasure to take care of you today. Thank you for choosing our clinic. I hope that you start to feel better quickly.  ° °Jernee Murtaugh, MSN, APRN, FNP-C, CEN °Advanced Practice Provider °[facility] °

## 2019-06-22 NOTE — ED Provider Notes (Signed)
[place]   Name: Rodney Callahan [facility] DOB: 03-15-1996 MRN: [id number] CSN: [id number] PCP: Scot Jun, MD (Inactive)  Arrival date and time:  06/22/19 1554  Chief Complaint:  Sore Throat and Cough   NOTE: Prior to seeing the patient today, I have reviewed the triage nursing documentation and vital signs. Clinical staff has updated patient's PMH/PSHx, current medication list, and drug allergies/intolerances to ensure comprehensive history available to assist in medical decision making.   History:   HPI: Rodney Callahan is a 24 y.o. male who presents today with complaints of cough, sore throat, and a generalized headache that started approximately 2 days ago. Patient reports subjective fevers. Cough is productive of yellow sputum and the patient complains of mild shortness of breath. He denies that he has experienced any nausea, vomiting, diarrhea, or abdominal pain. He is eating and drinking well. Patient reports being in close contact with his parents who had cold symptoms this weekend. Father has improved, however mother presents to clinic today with patient having a symptom constellation concerning for SARS-CoV-2 (novel coronavirus). He has not been tested for SARS-CoV-2 in the past 14 days; last tested negative about a month ago per his report. Patient has not been vaccinated for influenza this season. In efforts to conservatively manage his symptoms at home, the patient notes that he has used Dayquil/Nyquil and Halls cough drops, which has helped to improve his symptoms some.   Past Medical History:  Diagnosis Date  . Amblyopia of left eye   . Astigmatism   . DVD (dissociated vertical deviation)    Right Eye  . Exotropia   . Foreign body of esophagus   . Myopia   . Nystagmus   . Ptosis of left eyelid     Past Surgical History:  Procedure Laterality Date  . dysphagia    . ESOPHAGOGASTRODUODENOSCOPY N/A 11/21/2015   Procedure: ESOPHAGOGASTRODUODENOSCOPY  (EGD);  Surgeon: Scot Jun, MD;  Location: [facility] ENDOSCOPY;  Service: Endoscopy;  Laterality: N/A;  . ESOPHAGOGASTRODUODENOSCOPY (EGD) WITH PROPOFOL N/A 01/03/2015   Procedure: ESOPHAGOGASTRODUODENOSCOPY (EGD) WITH PROPOFOL;  Surgeon: Scot Jun, MD;  Location: [facility] ENDOSCOPY;  Service: Endoscopy;  Laterality: N/A;  . ESOPHAGOGASTRODUODENOSCOPY (EGD) WITH PROPOFOL N/A 04/29/2015   Procedure: ESOPHAGOGASTRODUODENOSCOPY (EGD) WITH PROPOFOL;  Surgeon: Scot Jun, MD;  Location: [facility] ENDOSCOPY;  Service: Endoscopy;  Laterality: N/A;  . ESOPHAGOGASTRODUODENOSCOPY (EGD) WITH PROPOFOL N/A 02/10/2016   Procedure: ESOPHAGOGASTRODUODENOSCOPY (EGD) WITH PROPOFOL;  Surgeon: Scot Jun, MD;  Location: [facility] ENDOSCOPY;  Service: Endoscopy;  Laterality: N/A;  . EYE SURGERY     eyelid surgery; frontalis suspension  . EYE SURGERY     strabismus eye surgery; bilat; lateral rectus recession  . EYE SURGERY     strabismus eye surgery; bilat; superior recuts recessions - OU; medial rectus resection - OS  . EYE SURGERY     strabismus eye surgery; Left; inferior oblique anteriorization w/dissection of scar tissue  . FRONTALIS SUSPENSION    . SAVORY DILATION N/A 04/29/2015   Procedure: SAVORY DILATION;  Surgeon: Scot Jun, MD;  Location: [facility] ENDOSCOPY;  Service: Endoscopy;  Laterality: N/A;  . STRABISMUS SURGERY    . TONSILLECTOMY      Family History  Problem Relation Age of Onset  . ADD / ADHD Mother   . Osteoarthritis Father     Social History   Tobacco Use  . Smoking status: Never Smoker  . Smokeless tobacco: Never Used  Substance Use Topics  . Alcohol use:  Yes    Comment: socially  . Drug use: No    There are no problems to display for this patient.   Home Medications:    No outpatient medications have been marked as taking for the 06/22/19 encounter [facility]counter).    Allergies:   Acyclovir and related, Peach flavor, and Other  Review of Systems  (ROS): Review of Systems  Constitutional: Positive for fever (subjective). Negative for fatigue.  HENT: Positive for sore throat. Negative for congestion, ear pain, postnasal drip, rhinorrhea, sinus pressure, sinus pain and sneezing.   Eyes: Negative for pain, discharge and redness.  Respiratory: Positive for cough and shortness of breath. Negative for chest tightness.   Cardiovascular: Negative for chest pain and palpitations.  Gastrointestinal: Negative for abdominal pain, diarrhea, nausea and vomiting.  Musculoskeletal: Negative for arthralgias, back pain, myalgias and neck pain.  Skin: Negative for color change, pallor and rash.  Allergic/Immunologic: Negative for environmental allergies.  Neurological: Positive for headaches. Negative for dizziness, syncope and weakness.  Hematological: Negative for adenopathy.     Vital Signs: Today's Vitals   06/22/19 1618 06/22/19 1619 06/22/19 1622 06/22/19 1709  BP:   126/75   Pulse:   80   Temp:   98.1 F (36.7 C)   TempSrc:   Oral   SpO2:   98%   Weight:  (!) 323 lb (146.5 kg)    Height:  6\' 7"  (2.007 m)    PainSc: 6    6     Physical Exam: Physical Exam  Constitutional: He is oriented to person, place, and time and well-developed, well-nourished, and in no distress.  HENT:  Head: Normocephalic and atraumatic.  Right Ear: Tympanic membrane normal.  Left Ear: Tympanic membrane normal.  Mouth/Throat: Uvula is midline and mucous membranes are normal. Posterior oropharyngeal edema (mild) and posterior oropharyngeal erythema present. No oropharyngeal exudate.  Eyes: Pupils are equal, round, and reactive to light.  Cardiovascular: Normal rate, regular rhythm, normal heart sounds and intact distal pulses.  Pulmonary/Chest: Effort normal and breath sounds normal.  Moderate cough noted in clinic. No SOB or increased WOB. No distress. Able to speak in complete sentences without difficulties. SPO2 98% on room air.   Neurological: He is alert  and oriented to person, place, and time. Gait normal.  Skin: Skin is warm and dry. No rash noted. He is not diaphoretic.  Psychiatric: Mood, memory, affect and judgment normal.  Nursing note and vitals reviewed.   Urgent Care Treatments / Results:   Orders Placed This Encounter  Procedures  . Group A Strep by PCR  . Novel Coronavirus, NAA (Hosp order, Send-out to Ref Lab; TAT 18-24 hrs    LABS: PLEASE NOTE: all labs that were ordered this encounter are listed, however only abnormal results are displayed. Labs Reviewed  GROUP A STREP BY PCR  NOVEL CORONAVIRUS, NAA (HOSP ORDER, SEND-OUT TO REF LAB; TAT 18-24 HRS)    EKG: -None  RADIOLOGY: No results found.  PROCEDURES: Procedures  MEDICATIONS RECEIVED THIS VISIT: Medications - No data to display  PERTINENT CLINICAL COURSE NOTES/UPDATES:   Initial Impression / Assessment and Plan / Urgent Care Course:  Pertinent labs & imaging results that were available during my care of the patient were personally reviewed by me and considered in my medical decision making (see lab/imaging section of note for values and interpretations).  Laura Radilla is a 24 y.o. male who presents to [facility] today with complaints of Sore Throat and Cough  Patient overall well appearing and in no acute distress today in clinic. Presenting symptoms (see HPI) and exam as documented above. He presents with symptoms associated with SARS-CoV-2 (novel coronavirus). Discussed typical symptom constellation. Reviewed potential for infection and need for testing. Patient amenable to being tested. SARS-CoV-2 swab collected by certified clinical staff. Discussed variable turn around times associated with testing, as swabs are being processed at [facility], and have been taking between 24-48 hours to come back. He was advised to self quarantine, per [facility] DHHS guidelines, until negative results received. These measures are being implemented out of an  abundance of caution to prevent transmission and spread during the current SARS-CoV-2 pandemic.  Patient has with co-morbidities that increase his SARS-CoV-2 morbidity and mortality risk including Body mass index is 36.39 kg/m. If patient found to be positive for SARS-CoV-2, his BMI alone should qualify him for treatment with the monoclonal antibody (bamlanivimab) infusion. Outpatient COVID response team to determine eligibility and contact the patient to discuss if she is deemed to be eligible for the infusion treatment.  PCR streptococcal throat swab (-). Presenting symptoms consistent with acute viral illness. Until ruled out with confirmatory lab testing, SARS-CoV-2 remains part of the differential. His testing is pending at this time. I discussed with him that his symptoms are felt to be viral in nature, thus antibiotics would not offer him any relief or improve his symptoms any faster than conservative symptomatic management.  Intervention for cough offered, however patient declined citing that his symptoms are mild/controlled. Discussed supportive care measures at home during acute phase of illness. Patient to rest as much as possible. He was encouraged to ensure adequate hydration (water and ORS) to prevent dehydration and electrolyte derangements. Recommended warm salt water gargles, hard candies/lozenges, and hot tea with honey/lemon to help soothe the throat and reduce irritation. May use Tylenol and/or Ibuprofen as needed for pain/fever.    Current clinical condition warrants patient being out of work in order to quarantine while waiting for testing results. He was provided with the appropriate documentation to provide to his place of employment that will allow for him to RTW on 06/25/2019 with no restrictions. RTW is contingent on his SARS-CoV-2 test results being reviewed as negative.     Discussed follow up with primary care physician in 1 week for re-evaluation. I have reviewed the follow up  and strict return precautions for any new or worsening symptoms. Patient is aware of symptoms that would be deemed urgent/emergent, and would thus require further evaluation either here or in the emergency department. At the time of discharge, he verbalized understanding and consent with the discharge plan as it was reviewed with him. All questions were fielded by provider and/or clinic staff prior to patient discharge.    Final Clinical Impressions / Urgent Care Diagnoses:   Final diagnoses:  Viral URI with cough  Encounter for laboratory testing for COVID-19 virus  Advice given about COVID-19 virus infection    New Prescriptions:   Controlled Substance Registry consulted? Not Applicable  No orders of the defined types were placed in this encounter.   Recommended Follow up Care:  Patient encouraged to follow up with the following provider within the specified time frame, or sooner as dictated by the severity of his symptoms. As always, he was instructed that for any urgent/emergent care needs, he should seek care either here or in the emergency department for more immediate evaluation.  Follow-up Information    Scot Jun, MD In 1 week.  Specialty: Gastroenterology Why: General reassessment of symptoms if not improving Contact information: [place]  [address] [phone]         NOTE: This note was prepared using Dragon dictation software along with smaller phrase technology. Despite my best ability to proofread, there is the potential that transcriptional errors may still occur from this process, and are completely unintentional.    Karen Kitchens, NP 06/22/19 2353

## 2019-06-23 LAB — NOVEL CORONAVIRUS, NAA (HOSP ORDER, SEND-OUT TO REF LAB; TAT 18-24 HRS): SARS-CoV-2, NAA: DETECTED — AB

## 2019-06-24 ENCOUNTER — Telehealth: Payer: Self-pay | Admitting: Unknown Physician Specialty

## 2019-06-24 NOTE — Telephone Encounter (Signed)
Called to discuss with Rodney Callahan about Covid symptoms and the use of bamlanivimab, a monoclonal antibody infusion for those with mild to moderate Covid symptoms and at a high risk of hospitalization.     Pt is qualified for this infusion at the [facility] infusion center due to co-morbid conditions and/or a member of an at-risk group, however declines infusion at this time. Symptoms tier reviewed as well as criteria for ending isolation.  Symptoms reviewed that would warrant ED/Hospital evaluation. Preventative practices reviewed. Patient verbalized understanding. Patient advised to call back if he decides that he does want to get infusion. Callback number to the infusion center given. Patient advised to go to Urgent care or ED with severe symptoms. Last date eligible for infusion is 2/12

## 2020-02-19 ENCOUNTER — Other Ambulatory Visit: Payer: Self-pay

## 2020-02-19 ENCOUNTER — Encounter: Payer: Self-pay | Admitting: Emergency Medicine

## 2020-02-19 ENCOUNTER — Ambulatory Visit
Admission: EM | Admit: 2020-02-19 | Discharge: 2020-02-19 | Disposition: A | Discharge status: Home / Self Care | Payer: BC Managed Care – PPO | Attending: Family Medicine | Admitting: Family Medicine

## 2020-02-19 DIAGNOSIS — R600 Localized edema: Secondary | ICD-10-CM | POA: Diagnosis not present

## 2020-02-19 NOTE — ED Provider Notes (Signed)
[facility]    CSN: [id number] Arrival date & time: 02/19/20  1012  History   Chief Complaint Chief Complaint  Patient presents with   Leg Swelling   HPI  24 year old male presents with the above complaint.  Patient reports that he has noticed bilateral lower extremity swelling and redness for the past several months.  He states that it started at the beginning of summer.  He has a picture on his phone which shows significant erythema of the lower extremities.  He states that it seems to be worse when he is on his feet for prolonged period of time.  He is not particularly active.  No pain of the lower extremities.  No chest pain or shortness of breath.  No relieving factors.  No other complaints.  Past Medical History:  Diagnosis Date   Amblyopia of left eye    Astigmatism    DVD (dissociated vertical deviation)    Right Eye   Exotropia    Foreign body of esophagus    Myopia    Nystagmus    Ptosis of left eyelid    Past Surgical History:  Procedure Laterality Date   dysphagia     ESOPHAGOGASTRODUODENOSCOPY N/A 11/21/2015   Procedure: ESOPHAGOGASTRODUODENOSCOPY (EGD);  Surgeon: Scot Jun, MD;  Location: [facility] ENDOSCOPY;  Service: Endoscopy;  Laterality: N/A;   ESOPHAGOGASTRODUODENOSCOPY (EGD) WITH PROPOFOL N/A 01/03/2015   Procedure: ESOPHAGOGASTRODUODENOSCOPY (EGD) WITH PROPOFOL;  Surgeon: Scot Jun, MD;  Location: [facility] ENDOSCOPY;  Service: Endoscopy;  Laterality: N/A;   ESOPHAGOGASTRODUODENOSCOPY (EGD) WITH PROPOFOL N/A 04/29/2015   Procedure: ESOPHAGOGASTRODUODENOSCOPY (EGD) WITH PROPOFOL;  Surgeon: Scot Jun, MD;  Location: [facility] ENDOSCOPY;  Service: Endoscopy;  Laterality: N/A;   ESOPHAGOGASTRODUODENOSCOPY (EGD) WITH PROPOFOL N/A 02/10/2016   Procedure: ESOPHAGOGASTRODUODENOSCOPY (EGD) WITH PROPOFOL;  Surgeon: Scot Jun, MD;  Location: [facility] ENDOSCOPY;  Service: Endoscopy;  Laterality: N/A;   EYE SURGERY     eyelid surgery;  frontalis suspension   EYE SURGERY     strabismus eye surgery; bilat; lateral rectus recession   EYE SURGERY     strabismus eye surgery; bilat; superior recuts recessions - OU; medial rectus resection - OS   EYE SURGERY     strabismus eye surgery; Left; inferior oblique anteriorization w/dissection of scar tissue   FRONTALIS SUSPENSION     SAVORY DILATION N/A 04/29/2015   Procedure: SAVORY DILATION;  Surgeon: Scot Jun, MD;  Location: [facility] ENDOSCOPY;  Service: Endoscopy;  Laterality: N/A;   STRABISMUS SURGERY     TONSILLECTOMY         Home Medications    Prior to Admission medications   Medication Sig Start Date End Date Taking? Authorizing Provider  albuterol (VENTOLIN HFA) 108 (90 Base) MCG/ACT inhaler Inhale 1-2 puffs into the lungs every 6 (six) hours as needed for wheezing or shortness of breath. 01/27/19 06/22/19  Domenick Gong, MD  fluticasone (FLONASE) 50 MCG/ACT nasal spray Place 2 sprays into both nostrils daily. 01/27/19 06/22/19  Domenick Gong, MD  omeprazole (PRILOSEC) 40 MG capsule Take 40 mg by mouth daily.  01/27/19  [provider]    Family History Family History  Problem Relation Age of Onset   ADD / ADHD Mother    Osteoarthritis Father     Social History Social History   Tobacco Use   Smoking status: Never Smoker   Smokeless tobacco: Never Used  Building services engineer Use: Never used  Substance Use Topics   Alcohol use: Yes  Comment: socially   Drug use: No     Allergies   Acyclovir and related, Peach flavor, and Other   Review of Systems Review of Systems  Constitutional: Negative.   Respiratory: Negative for shortness of breath.   Cardiovascular: Positive for leg swelling. Negative for chest pain.   Physical Exam Triage Vital Signs ED Triage Vitals  Enc Vitals Group     BP 02/19/20 1033 (!) 141/75     Pulse Rate 02/19/20 1033 77     Resp 02/19/20 1033 18     Temp 02/19/20 1033 98.2 F (36.8 C)      Temp Source 02/19/20 1033 Oral     SpO2 02/19/20 1033 97 %     Weight 02/19/20 1030 (!) 322 lb 15.6 oz (146.5 kg)     Height 02/19/20 1030 6\' 7"  (2.007 m)     Head Circumference --      Peak Flow --      Pain Score 02/19/20 1030 0     Pain Loc --      Pain Edu? --      Excl. in GC? --    Updated Vital Signs BP (!) 141/75 (BP Location: Left Arm)    Pulse 77    Temp 98.2 F (36.8 C) (Oral)    Resp 18    Ht 6\' 7"  (2.007 m)    Wt (!) 146.5 kg    SpO2 97%    BMI 36.38 kg/m   Visual Acuity Right Eye Distance:   Left Eye Distance:   Bilateral Distance:    Right Eye Near:   Left Eye Near:    Bilateral Near:     Physical Exam Vitals and nursing note reviewed.  Constitutional:      General: He is not in acute distress.    Appearance: Normal appearance. He is obese. He is not ill-appearing.  HENT:     Head: Normocephalic and atraumatic.  Eyes:     Conjunctiva/sclera: Conjunctivae normal.  Cardiovascular:     Rate and Rhythm: Normal rate and regular rhythm.     Heart sounds: No murmur heard.      Comments: 1+ pitting lower extremity edema bilaterally. Pulmonary:     Effort: Pulmonary effort is normal.     Breath sounds: Normal breath sounds. No wheezing or rales.  Neurological:     Mental Status: He is alert.  Psychiatric:        Mood and Affect: Mood normal.        Behavior: Behavior normal.    UC Treatments / Results  Labs (all labs ordered are listed, but only abnormal results are displayed) Labs Reviewed - No data to display  EKG   Radiology No results found.  Procedures Procedures (including critical care time)  Medications Ordered in UC Medications - No data to display  Initial Impression / Assessment and Plan / UC Course  I have reviewed the triage vital signs and the nursing notes.  Pertinent labs & imaging results that were available during my care of the patient were reviewed by me and considered in my medical decision making (see chart for  details).    24 year old male presents with ongoing/chronic lower extremity edema.  There is no evidence of cellulitis at this time.  He has pitting lower extremity edema on exam.  Mildly elevated blood pressure.  This is likely due to obesity and inactivity.  Advised dietary/lifestyle changes.  See instructions below.  Advised to consider compression stockings.  May benefit from diuretic therapy given elevated blood pressure.  Needs close primary care follow-up.   Final Clinical Impressions(s) / UC Diagnoses   Final diagnoses:  Leg edema     Discharge Instructions     Elevate legs.  Stay active.  Watch diet. Decrease salt intake.   Consider compression socks.  Follow up with your Primary care physician.  If you BP stays elevated you may need blood pressure medication.   ED Prescriptions    None     PDMP not reviewed this encounter.   Tommie Sams, [place] 02/19/20 1126

## 2020-02-19 NOTE — Discharge Instructions (Addendum)
Elevate legs.  Stay active.  Watch diet. Decrease salt intake.   Consider compression socks.  Follow up with your Primary care physician.  If you BP stays elevated you may need blood pressure medication.

## 2020-02-19 NOTE — ED Triage Notes (Signed)
Pt c/o bilateral leg swelling and redness. Started at the beginning of summer. His legs do not appear to be red or swollen today. Pt states this has only occurred when he is goes to work.

## 2020-02-26 ENCOUNTER — Emergency Department
Admission: EM | Admit: 2020-02-26 | Discharge: 2020-02-26 | Discharge status: Against Medical Advice | Payer: BC Managed Care – PPO

## 2020-02-26 ENCOUNTER — Other Ambulatory Visit: Payer: Self-pay

## 2020-09-16 ENCOUNTER — Ambulatory Visit
Admission: EM | Admit: 2020-09-16 | Discharge: 2020-09-16 | Disposition: A | Discharge status: Home / Self Care | Payer: Self-pay | Attending: Emergency Medicine | Admitting: Emergency Medicine

## 2020-09-16 ENCOUNTER — Other Ambulatory Visit: Payer: Self-pay

## 2020-09-16 DIAGNOSIS — L989 Disorder of the skin and subcutaneous tissue, unspecified: Secondary | ICD-10-CM | POA: Insufficient documentation

## 2020-09-16 DIAGNOSIS — L03116 Cellulitis of left lower limb: Secondary | ICD-10-CM | POA: Insufficient documentation

## 2020-09-16 MED ORDER — MUPIROCIN CALCIUM 2 % NA OINT: TOPICAL_OINTMENT | NASAL | 0 refills | Status: DC

## 2020-09-16 MED ORDER — DOXYCYCLINE HYCLATE 100 MG PO CAPS: 100.0000 mg | ORAL_CAPSULE | Freq: Two times a day (BID) | ORAL | 0 refills | Status: DC

## 2020-09-16 NOTE — ED Triage Notes (Signed)
Pt c/o left foot pain since February, states it worsened last night. No known injury.

## 2020-09-16 NOTE — Discharge Instructions (Addendum)
Keep the wound on the inside of your left lower leg clean and dry and covered with a nonadherent dressing.  Change the dressing 3 times a day and apply mupirocin ointment to help fight the infection.  Take the doxycycline twice daily with food for 10 days for treatment of your cellulitis.  Keep your left ankle elevated as much as possible to decrease swelling and aid in healing.  When you wear your boots make sure that she wear a nonadherent dressing over top of your wound as well as tall shaft socks to protect the wound from further abrasion and erosion by your boot shaft.  If you develop red cheeks going up your leg, increased swelling of your leg, fullness in your groin, or fever you need to return for reevaluation or go to the ER.

## 2020-09-16 NOTE — ED Provider Notes (Signed)
[facility]    CSN: [id number] Arrival date & time: 09/16/20  1449      History   Chief Complaint Chief Complaint  Patient presents with  . Foot Pain    HPI Rodney Callahan is a 25 y.o. male.   HPI   25 year old male here for evaluation of left foot pain.  Patient reports that he has had pain in his left ankle since February.  He denies any numbness or tingling and he cannot think of any injury.  When asked where he has pain he says is in the back of his ankle.  Patient asked to show me where he hurts and when he lifted his pant leg there is a smelly wound on the inner aspect of the left lower leg with dried matted drainage around it and surrounding redness.  Patient says he is unsure of how the wound got there and did not know that it was there until he pulled up his pant leg.  Past Medical History:  Diagnosis Date  . Amblyopia of left eye   . Astigmatism   . DVD (dissociated vertical deviation)    Right Eye  . Exotropia   . Foreign body of esophagus   . Myopia   . Nystagmus   . Ptosis of left eyelid     There are no problems to display for this patient.   Past Surgical History:  Procedure Laterality Date  . dysphagia    . ESOPHAGOGASTRODUODENOSCOPY N/A 11/21/2015   Procedure: ESOPHAGOGASTRODUODENOSCOPY (EGD);  Surgeon: Scot Jun, MD;  Location: [facility] ENDOSCOPY;  Service: Endoscopy;  Laterality: N/A;  . ESOPHAGOGASTRODUODENOSCOPY (EGD) WITH PROPOFOL N/A 01/03/2015   Procedure: ESOPHAGOGASTRODUODENOSCOPY (EGD) WITH PROPOFOL;  Surgeon: Scot Jun, MD;  Location: [facility] ENDOSCOPY;  Service: Endoscopy;  Laterality: N/A;  . ESOPHAGOGASTRODUODENOSCOPY (EGD) WITH PROPOFOL N/A 04/29/2015   Procedure: ESOPHAGOGASTRODUODENOSCOPY (EGD) WITH PROPOFOL;  Surgeon: Scot Jun, MD;  Location: [facility] ENDOSCOPY;  Service: Endoscopy;  Laterality: N/A;  . ESOPHAGOGASTRODUODENOSCOPY (EGD) WITH PROPOFOL N/A 02/10/2016   Procedure: ESOPHAGOGASTRODUODENOSCOPY  (EGD) WITH PROPOFOL;  Surgeon: Scot Jun, MD;  Location[facility] ENDOSCOPY;  Service: Endoscopy;  Laterality: N/A;  . EYE SURGERY     eyelid surgery; frontalis suspension  . EYE SURGERY     strabismus eye surgery; bilat; lateral rectus recession  . EYE SURGERY     strabismus eye surgery; bilat; superior recuts recessions - OU; medial rectus resection - OS  . EYE SURGERY     strabismus eye surgery; Left; inferior oblique anteriorization w/dissection of scar tissue  . FRONTALIS SUSPENSION    . SAVORY DILATION N/A 04/29/2015   Procedure: SAVORY DILATION;  Surgeon: Scot Jun, MD;  Location: [facility] ENDOSCOPY;  Service: Endoscopy;  Laterality: N/A;  . STRABISMUS SURGERY    . TONSILLECTOMY         Home Medications    Prior to Admission medications   Medication Sig Start Date End Date Taking? Authorizing Provider  doxycycline (VIBRAMYCIN) 100 MG capsule Take 1 capsule (100 mg total) by mouth 2 (two) times daily. 09/16/20  Yes Becky Augusta, NP  mupirocin nasal ointment (BACTROBAN) 2 % Apply to wound 3 times a day. 09/16/20  Yes Becky Augusta, NP  albuterol (VENTOLIN HFA) 108 (90 Base) MCG/ACT inhaler Inhale 1-2 puffs into the lungs every 6 (six) hours as needed for wheezing or shortness of breath. 01/27/19 06/22/19  Domenick Gong, MD  fluticasone (FLONASE) 50 MCG/ACT nasal spray Place 2 sprays into both nostrils  daily. 01/27/19 06/22/19  Domenick Gong, MD  omeprazole (PRILOSEC) 40 MG capsule Take 40 mg by mouth daily.  01/27/19  [provider]    Family History Family History  Problem Relation Age of Onset  . ADD / ADHD Mother   . Osteoarthritis Father     Social History Social History   Tobacco Use  . Smoking status: Never Smoker  . Smokeless tobacco: Never Used  Vaping Use  . Vaping Use: Never used  Substance Use Topics  . Alcohol use: Yes    Comment: socially  . Drug use: No     Allergies   Other, Acyclovir and related, and Peach flavor   Review of  Systems Review of Systems  Constitutional: Negative for activity change, appetite change and fever.  Musculoskeletal: Positive for myalgias.  Skin: Positive for color change and wound.  Neurological: Negative for weakness and numbness.  Hematological: Negative.   Psychiatric/Behavioral: Negative.      Physical Exam Triage Vital Signs ED Triage Vitals  Enc Vitals Group     BP 09/16/20 1519 (!) 145/81     Pulse Rate 09/16/20 1519 78     Resp 09/16/20 1519 16     Temp 09/16/20 1519 98.3 F (36.8 C)     Temp Source 09/16/20 1519 Oral     SpO2 09/16/20 1519 98 %     Weight 09/16/20 1519 (!) 375 lb (170.1 kg)     Height 09/16/20 1519 6\' 7"  (2.007 m)     Head Circumference --      Peak Flow --      Pain Score 09/16/20 1518 8     Pain Loc --      Pain Edu? --      Excl. in GC? --    No data found.  Updated Vital Signs BP (!) 145/81 (BP Location: Left Arm)   Pulse 78   Temp 98.3 F (36.8 C) (Oral)   Resp 16   Ht 6\' 7"  (2.007 m)   Wt (!) 375 lb (170.1 kg)   SpO2 98%   BMI 42.25 kg/m   Visual Acuity Right Eye Distance:   Left Eye Distance:   Bilateral Distance:    Right Eye Near:   Left Eye Near:    Bilateral Near:     Physical Exam Vitals and nursing note reviewed.  Constitutional:      General: He is not in acute distress.    Appearance: Normal appearance. He is not ill-appearing.  HENT:     Head: Normocephalic and atraumatic.  Musculoskeletal:        General: Tenderness present.  Skin:    General: Skin is warm.     Capillary Refill: Capillary refill takes less than 2 seconds.     Findings: Erythema and lesion present.  Neurological:     General: No focal deficit present.     Mental Status: He is alert and oriented to person, place, and time.  Psychiatric:        Mood and Affect: Mood normal.        Behavior: Behavior normal.        Thought Content: Thought content normal.        Judgment: Judgment normal.      UC Treatments / Results  Labs (all  labs ordered are listed, but only abnormal results are displayed) Labs Reviewed  AEROBIC CULTURE W GRAM STAIN (SUPERFICIAL SPECIMEN)    EKG   Radiology No results found.  Procedures  Procedures (including critical care time)  Medications Ordered in UC Medications - No data to display  Initial Impression / Assessment and Plan / UC Course  I have reviewed the triage vital signs and the nursing notes.  Pertinent labs & imaging results that were available during my care of the patient were reviewed by me and considered in my medical decision making (see chart for details).   Patient is a nontoxic-appearing 25 year old male here for evaluation of left foot/ankle pain that has been going on since February.  Patient reports that his mother got him new boots and he thinks is because he was wearing his boots with ankle socks and his boot shaft has been rubbing on his ankle.  Patient denies numbness or tingling.  Physical exam reveals the presence of a wound on the inner aspect of the distal lower leg superior to the medial malleolus on the left side.  The wound is malodorous with surrounding erythema and heat.  There is dried dark drainage atop the wound bed.  Patient's DP PT pulses are 2+.  There is no induration surrounding the wound but the wound is tender to touch.  The wound is approximately 5 cm x 2-1/2 cm with pink granulation tissue in the bed.  Surface culture swab collected.  Patient's physical exam is consistent with cellulitis.  Will order wound care and nonadherent dressing application.  Will place patient on doxycycline twice daily for 10 days for treatment of the cellulitis.  We will also have patient use mupirocin 3 times daily for the next week.   Final Clinical Impressions(s) / UC Diagnoses   Final diagnoses:  Cellulitis of left lower extremity  Skin erosion     Discharge Instructions     Keep the wound on the inside of your left lower leg clean and dry and covered with a  nonadherent dressing.  Change the dressing 3 times a day and apply mupirocin ointment to help fight the infection.  Take the doxycycline twice daily with food for 10 days for treatment of your cellulitis.  Keep your left ankle elevated as much as possible to decrease swelling and aid in healing.  When you wear your boots make sure that she wear a nonadherent dressing over top of your wound as well as tall shaft socks to protect the wound from further abrasion and erosion by your boot shaft.  If you develop red cheeks going up your leg, increased swelling of your leg, fullness in your groin, or fever you need to return for reevaluation or go to the ER.    ED Prescriptions    Medication Sig Dispense Auth. Provider   doxycycline (VIBRAMYCIN) 100 MG capsule Take 1 capsule (100 mg total) by mouth 2 (two) times daily. 20 capsule Becky Augusta, NP   mupirocin nasal ointment (BACTROBAN) 2 % Apply to wound 3 times a day. 22 g Becky Augusta, NP     PDMP not reviewed this encounter.   Becky Augusta, NP 09/16/20 1624

## 2020-09-18 LAB — AEROBIC CULTURE W GRAM STAIN (SUPERFICIAL SPECIMEN): Special Requests: NORMAL

## 2020-10-25 ENCOUNTER — Other Ambulatory Visit: Payer: Self-pay

## 2020-10-25 ENCOUNTER — Encounter: Payer: Self-pay | Admitting: Emergency Medicine

## 2020-10-25 ENCOUNTER — Ambulatory Visit
Admission: EM | Admit: 2020-10-25 | Discharge: 2020-10-25 | Disposition: A | Discharge status: Home / Self Care | Payer: Self-pay | Attending: Emergency Medicine | Admitting: Emergency Medicine

## 2020-10-25 ENCOUNTER — Ambulatory Visit (INDEPENDENT_AMBULATORY_CARE_PROVIDER_SITE_OTHER): Payer: Self-pay

## 2020-10-25 DIAGNOSIS — L97329 Non-pressure chronic ulcer of left ankle with unspecified severity: Secondary | ICD-10-CM

## 2020-10-25 DIAGNOSIS — L97309 Non-pressure chronic ulcer of unspecified ankle with unspecified severity: Secondary | ICD-10-CM | POA: Insufficient documentation

## 2020-10-25 LAB — GLUCOSE, CAPILLARY: Glucose-Capillary: 80 mg/dL (ref 70–99)

## 2020-10-25 MED ORDER — MUPIROCIN 2 % EX OINT: 1.0000 "application " | TOPICAL_OINTMENT | Freq: Two times a day (BID) | CUTANEOUS | 0 refills | Status: DC

## 2020-10-25 NOTE — ED Triage Notes (Signed)
Patient states that he was seen here on 09/16/20 for left ankle pain.  Patient states that he has finished the antibiotic given to him.  Patient states that he continues to have pain in the left  ankle.

## 2020-10-25 NOTE — ED Provider Notes (Signed)
HPI  SUBJECTIVE:  Rodney Callahan is a 25 y.o. male who presents with persistent daily burning minutes long intermittent left medial ankle pain that has been present since February.  Patient was seen here on 5/2 for this.  He had an ulcer that he was not aware of at the time, was thought to have a cellulitis and was sent home on doxycycline twice daily for 10 days and Bactroban 3 times daily for 1 week.  States that he finished the doxycycline and is still using the Bactroban twice a day.  He states that this seems to be getting a little better, but it has not resolved.  He reports continued crusting.  No fevers, bodies, erythema, ankle swelling, limitation of motion of the ankle, recent trauma to the ankle.  There are no alleviating factors.  Symptoms are worse with palpation.  He has no pain with movement, weightbearing.  He denies thirst, unintentional weight loss, polyuria.  He denies any HIV risk factors such as IV drug use, recent transfusion, receptive anal sex.  No history of HIV, diabetes, PVD/PAD, MRSA, immunocompromise, current steroid use.  PMD: None.  Past Medical History:  Diagnosis Date   Amblyopia of left eye    Astigmatism    DVD (dissociated vertical deviation)    Right Eye   Exotropia    Foreign body of esophagus    Myopia    Nystagmus    Ptosis of left eyelid     Past Surgical History:  Procedure Laterality Date   dysphagia     ESOPHAGOGASTRODUODENOSCOPY N/A 11/21/2015   Procedure: ESOPHAGOGASTRODUODENOSCOPY (EGD);  Surgeon: Scot Jun, MD;  Location: [facility] ENDOSCOPY;  Service: Endoscopy;  Laterality: N/A;   ESOPHAGOGASTRODUODENOSCOPY (EGD) WITH PROPOFOL N/A 01/03/2015   Procedure: ESOPHAGOGASTRODUODENOSCOPY (EGD) WITH PROPOFOL;  Surgeon: Scot Jun, MD;  Location: [facility] ENDOSCOPY;  Service: Endoscopy;  Laterality: N/A;   ESOPHAGOGASTRODUODENOSCOPY (EGD) WITH PROPOFOL N/A 04/29/2015   Procedure: ESOPHAGOGASTRODUODENOSCOPY (EGD) WITH PROPOFOL;  Surgeon:  Scot Jun, MD;  Location: [facility] ENDOSCOPY;  Service: Endoscopy;  Laterality: N/A;   ESOPHAGOGASTRODUODENOSCOPY (EGD) WITH PROPOFOL N/A 02/10/2016   Procedure: ESOPHAGOGASTRODUODENOSCOPY (EGD) WITH PROPOFOL;  Surgeon: Scot Jun, MD;  Location: [facility] ENDOSCOPY;  Service: Endoscopy;  Laterality: N/A;   EYE SURGERY     eyelid surgery; frontalis suspension   EYE SURGERY     strabismus eye surgery; bilat; lateral rectus recession   EYE SURGERY     strabismus eye surgery; bilat; superior recuts recessions - OU; medial rectus resection - OS   EYE SURGERY     strabismus eye surgery; Left; inferior oblique anteriorization w/dissection of scar tissue   FRONTALIS SUSPENSION     SAVORY DILATION N/A 04/29/2015   Procedure: SAVORY DILATION;  Surgeon: Scot Jun, MD;  Location: [facility] ENDOSCOPY;  Service: Endoscopy;  Laterality: N/A;   STRABISMUS SURGERY     TONSILLECTOMY      Family History  Problem Relation Age of Onset   ADD / ADHD Mother    Osteoarthritis Father     Social History   Tobacco Use   Smoking status: Never   Smokeless tobacco: Never  Vaping Use   Vaping Use: Never used  Substance Use Topics   Alcohol use: Yes    Comment: socially   Drug use: No    No current facility-administered medications for this encounter.  Current Outpatient Medications:    mupirocin nasal ointment (BACTROBAN) 2 %, Apply to wound 3 times a day., Disp: 22 g,  Rfl: 0  Allergies  Allergen Reactions   Other Swelling, Rash and Anaphylaxis    Reactive agents: [employer] Barbeque Sauce & Lemon Pepper LEMON PEPPER   Acyclovir And Related    Peach Flavor Hives     ROS  As noted in HPI.   Physical Exam  BP 133/70 (BP Location: Left Arm)   Pulse 73   Temp 97.8 F (36.6 C) (Oral)   Resp 16   Ht 6\' 7"  (2.007 m)   Wt (!) 167.8 kg   SpO2 97%   BMI 41.68 kg/m   Constitutional: Well developed, well nourished, no acute distress Eyes:  EOMI, conjunctiva normal bilaterally HENT:  Normocephalic, atraumatic,mucus membranes moist Respiratory: Normal inspiratory effort Cardiovascular: Normal rate GI: nondistended skin: 3 x 1 tender healing ulcer/wound with yellowish crusting on medial left ankle.     Musculoskeletal: No medial/lateral malleoli tenderness.  No pain with active full range of motion.  DP 2+.  Sensation distally grossly intact to light touch and temperature.  Foot nontender. Neurologic: Alert & oriented x 3, no focal neuro deficits Psychiatric: Speech and behavior appropriate   ED Course   Medications - No data to display  Orders Placed This Encounter  Procedures   DG Ankle Complete Left    Standing Status:   Standing    Number of Occurrences:   1    Order Specific Question:   Reason for Exam (SYMPTOM  OR DIAGNOSIS REQUIRED)    Answer:   Ulcer medial ankle for 6-week.  Rule out osteomyelitis.   Glucose, capillary    Standing Status:   Standing    Number of Occurrences:   1   Ambulatory referral to Wound Clinic    Referral Priority:   Routine    Referral Type:   Consultation    Referral Reason:   Specialty Services Required    Requested Specialty:   Wound Care    Number of Visits Requested:   1   CBG monitoring, ED    Standing Status:   Standing    Number of Occurrences:   1    Results for orders placed or performed during the hospital encounter of 10/25/20 (from the past 24 hour(s))  Glucose, capillary     Status: None   Collection Time: 10/25/20  5:43 PM  Result Value Ref Range   Glucose-Capillary 80 70 - 99 mg/dL   DG Ankle Complete Left  Result Date: 10/25/2020 CLINICAL DATA:  Medial ankle ulcer fro 6 weeks. EXAM: LEFT ANKLE COMPLETE - 3+ VIEW COMPARISON:  Left tibia and fibula x-rays dated August 17, 2011. FINDINGS: No acute fracture or dislocation. No bony destruction or periosteal reaction. Joint spaces are preserved. Bone mineralization is normal. Diffuse soft tissue swelling. IMPRESSION: 1. Diffuse soft tissue swelling. No acute  osseous abnormality. Electronically Signed   By: August 19, 2011 M.D.   On: 10/25/2020 18:33    ED Clinical Impression  1. Chronic skin ulcer of ankle [facility])      ED Assessment/Plan  Previous records reviewed.  As noted in HPI.  Patient with a chronic wound medial left ankle.  He denies risk factors for HIV.  Blood work is not available at the clinic right now.  will check a fingerstick to evaluate for diabetes. will also check an x-ray to rule out secondary osteomyelitis.    Reviewed imaging independently.  No osteomyelitis.  Diffuse soft tissue swelling.  See radiology report for full details.  Glucose 80.  He is not a  diabetic.  This appears to be baseline.  Does not appear to be a worsening cellulitis.  Do not think that he would benefit from another round of antibiotics.  Will refer to wound clinic for further management.  He is to continue the Bactroban twice a day, will provide primary care list for ongoing care and order assistance in finding a PMD.  Discussed labs, imaging, MDM, treatment plan, and plan for follow-up with patient. patient agrees with plan.   No orders of the defined types were placed in this encounter.     *This clinic note was created using Dragon dictation software. Therefore, there may be occasional mistakes despite careful proofreading.  ?    Domenick Gong, MD 10/25/20 1842

## 2020-10-25 NOTE — Discharge Instructions (Addendum)
Keep it clean with soap and water and continue the Bactroban.  Give it some dry time so that he can continue healing.  Follow-up with the wound care clinic.  I have placed a referral to them.  Here is a list of primary care providers who are taking new patients:  Dr. Elizabeth Sauer [address] [place] [address] [phone]  [facility] at [facility] [address] [place], [place] [address] [phone]  [facility] [place] [address] [place] [address]  [phone]  [facility] [address] [place], [place] [address] [phone]  [facility] [address] [place]  [phone] [place], [place] [address]  Here are clinics/ other resources who will see you if you do not have insurance. Some have certain criteria that you must meet. Call them and find out what they are:  [facility]: [address] [place], [place] [address] Phone: [phone] Hours: First and Third Saturdays of each Month, 9 a.m. - 1 p.m.  [facility]: [address], [address] [place], [place] [address] Phone: [phone] Hours: Tuesday, 4 p.m. - 8 p.m. Thursday, 1 p.m. - 8 p.m. Wednesday, 9 a.m. - [facility] [address] [place], [place] [address] Phone: [phone] Pharmacy Phone Number: [phone] Dental Phone Number: [phone] [facility] Insurance Help: [phone]  Dental Hours: Monday - Thursday, 8 a.m. - 6 p.m.  [facility] [address], [place], [place] [address] Phone: [phone] Pharmacy Phone Number: [phone] [facility] Insurance Help: [phone]  [facility] [address] [place] [place], [place] [address] Phone: [phone] Pharmacy Phone Number: [phone] [facility] Insurance Help: [phone]  [facility] [address] [place], [place] [address] Phone: [phone] [facility] Insurance Help: [phone]   [facility] [address], [place], [place] [address] Phone: [phone]  Go to www.goodrx.com  or www.costplusdrugs.com to look up your medications. This will give you a list of where you can find your prescriptions at the most affordable prices. Or ask the pharmacist what the cash price is, or if they have any other discount programs available to help make your medication more affordable. This can be less expensive than what you would pay with insurance.

## 2021-03-26 ENCOUNTER — Encounter: Payer: Self-pay | Admitting: Emergency Medicine

## 2021-03-26 ENCOUNTER — Other Ambulatory Visit: Payer: Self-pay

## 2021-03-26 ENCOUNTER — Ambulatory Visit
Admission: EM | Admit: 2021-03-26 | Discharge: 2021-03-26 | Disposition: A | Discharge status: Home / Self Care | Payer: BC Managed Care – PPO | Attending: Emergency Medicine | Admitting: Emergency Medicine

## 2021-03-26 DIAGNOSIS — Z20822 Contact with and (suspected) exposure to covid-19: Secondary | ICD-10-CM | POA: Insufficient documentation

## 2021-03-26 DIAGNOSIS — R197 Diarrhea, unspecified: Secondary | ICD-10-CM | POA: Insufficient documentation

## 2021-03-26 DIAGNOSIS — J069 Acute upper respiratory infection, unspecified: Secondary | ICD-10-CM

## 2021-03-26 DIAGNOSIS — R0989 Other specified symptoms and signs involving the circulatory and respiratory systems: Secondary | ICD-10-CM | POA: Diagnosis present

## 2021-03-26 MED ORDER — IPRATROPIUM BROMIDE 0.06 % NA SOLN: 2.0000 | Freq: Four times a day (QID) | NASAL | 12 refills | Status: DC

## 2021-03-26 MED ORDER — BENZONATATE 100 MG PO CAPS: 200.0000 mg | ORAL_CAPSULE | Freq: Three times a day (TID) | ORAL | 0 refills | Status: DC

## 2021-03-26 MED ORDER — PROMETHAZINE-DM 6.25-15 MG/5ML PO SYRP: 5.0000 mL | ORAL_SOLUTION | Freq: Four times a day (QID) | ORAL | 0 refills | Status: DC | PRN

## 2021-03-26 NOTE — ED Notes (Signed)
Called in from parking lot  

## 2021-03-26 NOTE — ED Provider Notes (Signed)
[facility]    CSN: [id number] Arrival date & time: 03/26/21  4010      History   Chief Complaint Chief Complaint  Patient presents with   chest congestion    HPI Rodney Callahan is a 25 y.o. male.   HPI  25 year old male here for evaluation of respiratory complaints.  Patient reports that he has been having "chest congestion" for a week.  This is associated with a sore throat and a productive cough for clear sputum.  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  He is also had some intermittent diarrhea.  Patient denies fever, runny nose nasal congestion, ear pain, nausea vomiting, or body aches.  Reports that he was exposed to COVID 2 days ago and he is requesting a COVID test.  Past Medical History:  Diagnosis Date   Amblyopia of left eye    Astigmatism    DVD (dissociated vertical deviation)    Right Eye   Exotropia    Foreign body of esophagus    Myopia    Nystagmus    Ptosis of left eyelid     There are no problems to display for this patient.   Past Surgical History:  Procedure Laterality Date   dysphagia     ESOPHAGOGASTRODUODENOSCOPY N/A 11/21/2015   Procedure: ESOPHAGOGASTRODUODENOSCOPY (EGD);  Surgeon: Scot Jun, MD;  Location: [facility] ENDOSCOPY;  Service: Endoscopy;  Laterality: N/A;   ESOPHAGOGASTRODUODENOSCOPY (EGD) WITH PROPOFOL N/A 01/03/2015   Procedure: ESOPHAGOGASTRODUODENOSCOPY (EGD) WITH PROPOFOL;  Surgeon: Scot Jun, MD;  Location: [facility] ENDOSCOPY;  Service: Endoscopy;  Laterality: N/A;   ESOPHAGOGASTRODUODENOSCOPY (EGD) WITH PROPOFOL N/A 04/29/2015   Procedure: ESOPHAGOGASTRODUODENOSCOPY (EGD) WITH PROPOFOL;  Surgeon: Scot Jun, MD;  Location: [facility] ENDOSCOPY;  Service: Endoscopy;  Laterality: N/A;   ESOPHAGOGASTRODUODENOSCOPY (EGD) WITH PROPOFOL N/A 02/10/2016   Procedure: ESOPHAGOGASTRODUODENOSCOPY (EGD) WITH  PROPOFOL;  Surgeon: Scot Jun, MD;  Location: [facility] ENDOSCOPY;  Service: Endoscopy;  Laterality: N/A;   EYE SURGERY     eyelid surgery; frontalis suspension   EYE SURGERY     strabismus eye surgery; bilat; lateral rectus recession   EYE SURGERY     strabismus eye surgery; bilat; superior recuts recessions - OU; medial rectus resection - OS   EYE SURGERY     strabismus eye surgery; Left; inferior oblique anteriorization w/dissection of scar tissue   FRONTALIS SUSPENSION     SAVORY DILATION N/A 04/29/2015   Procedure: SAVORY DILATION;  Surgeon: Scot Jun, MD;  Location: [facility] ENDOSCOPY;  Service: Endoscopy;  Laterality: N/A;   STRABISMUS SURGERY     TONSILLECTOMY         Home Medications    Prior to Admission medications   Medication Sig Start Date End Date Taking? Authorizing Provider  benzonatate (TESSALON) 100 MG capsule Take 2 capsules (200 mg total) by mouth every 8 (eight) hours. 03/26/21  Yes Becky Augusta, NP  ipratropium (ATROVENT) 0.06 % nasal spray Place 2 sprays into both nostrils 4 (four) times daily. 03/26/21  Yes Becky Augusta, NP  promethazine-dextromethorphan (PROMETHAZINE-DM) 6.25-15 MG/5ML syrup Take 5 mLs by mouth 4 (four) times daily as needed. 03/26/21  Yes Becky Augusta, NP  mupirocin ointment (BACTROBAN) 2 % Apply 1 application topically 2 (two) times daily. 10/25/20   Domenick Gong, MD  albuterol (VENTOLIN HFA) 108 (90 Base) MCG/ACT inhaler Inhale 1-2 puffs into the lungs every 6 (  six) hours as needed for wheezing or shortness of breath. 01/27/19 06/22/19  Domenick Gong, MD  fluticasone (FLONASE) 50 MCG/ACT nasal spray Place 2 sprays into both nostrils daily. 01/27/19 06/22/19  Domenick Gong, MD  omeprazole (PRILOSEC) 40 MG capsule Take 40 mg by mouth daily.  01/27/19  [provider]    Family History Family History  Problem Relation Age of Onset   ADD / ADHD Mother    Osteoarthritis Father     Social History Social History    Tobacco Use   Smoking status: Never   Smokeless tobacco: Never  Vaping Use   Vaping Use: Never used  Substance Use Topics   Alcohol use: Yes    Comment: socially   Drug use: No     Allergies   Other, Acyclovir and related, and Peach flavor   Review of Systems Review of Systems  Constitutional:  Negative for activity change, appetite change and fever.  HENT:  Positive for sore throat. Negative for congestion, ear pain and rhinorrhea.   Respiratory:  Positive for cough, shortness of breath and wheezing.   Gastrointestinal:  Positive for diarrhea. Negative for nausea and vomiting.  Musculoskeletal:  Negative for arthralgias and myalgias.  Skin:  Negative for rash.  Hematological: Negative.   Psychiatric/Behavioral: Negative.      Physical Exam Triage Vital Signs ED Triage Vitals  Enc Vitals Group     BP 03/26/21 1105 134/80     Pulse Rate 03/26/21 1105 (!) 58     Resp 03/26/21 1105 18     Temp 03/26/21 1105 98.1 F (36.7 C)     Temp Source 03/26/21 1105 Oral     SpO2 03/26/21 1105 100 %     Weight --      Height --      Head Circumference --      Peak Flow --      Pain Score 03/26/21 1102 0     Pain Loc --      Pain Edu? --      Excl. in GC? --    No data found.  Updated Vital Signs BP 134/80 (BP Location: Left Arm)   Pulse (!) 58   Temp 98.1 F (36.7 C) (Oral)   Resp 18   SpO2 100%   Visual Acuity Right Eye Distance:   Left Eye Distance:   Bilateral Distance:    Right Eye Near:   Left Eye Near:    Bilateral Near:     Physical Exam Vitals and nursing note reviewed.  Constitutional:      General: He is not in acute distress.    Appearance: Normal appearance. He is not ill-appearing.  HENT:     Head: Normocephalic and atraumatic.     Right Ear: Tympanic membrane, ear canal and external ear normal. There is no impacted cerumen.     Left Ear: Tympanic membrane, ear canal and external ear normal. There is no impacted cerumen.     Nose:  Congestion and rhinorrhea present.     Mouth/Throat:     Mouth: Mucous membranes are moist.     Pharynx: Oropharynx is clear. Posterior oropharyngeal erythema present.  Cardiovascular:     Rate and Rhythm: Normal rate and regular rhythm.     Pulses: Normal pulses.     Heart sounds: Normal heart sounds. No murmur heard.   No gallop.  Pulmonary:     Effort: Pulmonary effort is normal. No respiratory distress.     Breath  sounds: Normal breath sounds. No wheezing, rhonchi or rales.  Musculoskeletal:     Cervical back: Normal range of motion and neck supple.  Lymphadenopathy:     Cervical: No cervical adenopathy.  Skin:    General: Skin is warm and dry.     Capillary Refill: Capillary refill takes less than 2 seconds.     Findings: No erythema or rash.  Neurological:     General: No focal deficit present.     Mental Status: He is alert and oriented to person, place, and time.  Psychiatric:        Mood and Affect: Mood normal.        Behavior: Behavior normal.        Thought Content: Thought content normal.        Judgment: Judgment normal.     UC Treatments / Results  Labs (all labs ordered are listed, but only abnormal results are displayed) Labs Reviewed  SARS CORONAVIRUS 2 (TAT 6-24 HRS)    EKG   Radiology No results found.  Procedures Procedures (including critical care time)  Medications Ordered in UC Medications - No data to display  Initial Impression / Assessment and Plan / UC Course  I have reviewed the triage vital signs and the nursing notes.  Pertinent labs & imaging results that were available during my care of the patient were reviewed by me and considered in my medical decision making (see chart for details).  Patient is a nontoxic-appearing 25 year old male here for evaluation of respiratory complaints as outlined in HPI above.  Patient is in no acute distress, can speak in full sentences, and does not display dyspnea or tachypnea.  Patient reports  that he has a cough "all day every day" but has not coughed a single time since being in the urgent care.  He also reports an extremely sore throat, shortness breath or wheezing, and diarrhea that have been ongoing for a week.  Patient's physical exam reveals pearly gray tympanic membranes bilaterally with normal light reflex and clear external auditory canals.  Nasal mucosa is mildly erythematous and edematous with scant clear nasal discharge.  Oropharyngeal exam reveals mild posterior oropharyngeal erythema with clear postnasal drip.  Tonsils are surgically absent.  No cervical lymphadenopathy appreciated on exam.  Cardiopulmonary exam reveals clear lung sounds in all fields.  Patient is requesting COVID test as he was exposed to a COVID-positive person 2 days ago.  Will swab patient for COVID and have him isolate at home.  I advised patient that if he is positive he has had symptoms for over 5 days and he is outside of his quarantine duration.  He is also outside of the window for antiviral therapy.  With the clear lung sounds and lack of dyspnea or tachypnea I do not feel it is necessary to provide the patient with an albuterol inhaler.  He is moving good air through his entire respiratory cycle and he has a room air saturation of 100%.  I will provide Atrovent nasal spray to help with the nasal congestion I found on his exam as well as Tessalon Perles and Promethazine DM cough syrup to help with cough and congestion.  I have given the patient a work note to cover for the isolation duration for his COVID test.  Patient inquiring about 14-day isolation duration and I have advised him that we have not had a 14-day isolation timeframe for significant length of time.  The current CDC recommendations is 5 days.  Patient left in stable condition and ambulatory.   Final Clinical Impressions(s) / UC Diagnoses   Final diagnoses:  Viral URI with cough     Discharge Instructions      Isolate at home pending  the results of your COVID test.  If you test positive then you will have to quarantine for 5 days from the start of your symptoms.  After 5 days you can break quarantine if your symptoms have improved and you have not had a fever for 24 hours without taking Tylenol or ibuprofen.  Use over-the-counter Tylenol and ibuprofen as needed for body aches and fever.  Use the Atrovent nasal spray, 2 squirts in each nostril every 6 hours, as needed for runny nose and postnasal drip.  Use the Tessalon Perles every 8 hours during the day.  Take them with a small sip of water.  They may give you some numbness to the base of your tongue or a metallic taste in your mouth, this is normal.  Use the Promethazine DM cough syrup at bedtime for cough and congestion.  It will make you drowsy so do not take it during the day.  If you develop any increased shortness of breath-especially at rest, you are unable to speak in full sentences, or is a late sign your lips are turning blue you need to go the ER for evaluation.      ED Prescriptions     Medication Sig Dispense Auth. Provider   ipratropium (ATROVENT) 0.06 % nasal spray Place 2 sprays into both nostrils 4 (four) times daily. 15 mL Becky Augusta, NP   benzonatate (TESSALON) 100 MG capsule Take 2 capsules (200 mg total) by mouth every 8 (eight) hours. 21 capsule Becky Augusta, NP   promethazine-dextromethorphan (PROMETHAZINE-DM) 6.25-15 MG/5ML syrup Take 5 mLs by mouth 4 (four) times daily as needed. 118 mL Becky Augusta, NP      PDMP not reviewed this encounter.   Becky Augusta, NP 03/26/21 1139

## 2021-03-26 NOTE — Discharge Instructions (Signed)
Isolate at home pending the results of your COVID test.  If you test positive then you will have to quarantine for 5 days from the start of your symptoms.  After 5 days you can break quarantine if your symptoms have improved and you have not had a fever for 24 hours without taking Tylenol or ibuprofen. ° °Use over-the-counter Tylenol and ibuprofen as needed for body aches and fever. ° °Use the Atrovent nasal spray, 2 squirts in each nostril every 6 hours, as needed for runny nose and postnasal drip. ° °Use the Tessalon Perles every 8 hours during the day.  Take them with a small sip of water.  They may give you some numbness to the base of your tongue or a metallic taste in your mouth, this is normal. ° °Use the Promethazine DM cough syrup at bedtime for cough and congestion.  It will make you drowsy so do not take it during the day. ° °If you develop any increased shortness of breath-especially at rest, you are unable to speak in full sentences, or is a late sign your lips are turning blue you need to go the ER for evaluation.  °

## 2021-03-26 NOTE — ED Triage Notes (Signed)
Pt presents today with c/o chest congestion x 1 week, denies fever. No OTC meds taken pta. He reports coming in contact with someone with Covid and requests to be tested today.

## 2021-03-27 LAB — SARS CORONAVIRUS 2 (TAT 6-24 HRS): SARS Coronavirus 2: NEGATIVE

## 2021-04-11 ENCOUNTER — Ambulatory Visit
Admission: EM | Admit: 2021-04-11 | Discharge: 2021-04-11 | Disposition: A | Discharge status: Home / Self Care | Payer: BC Managed Care – PPO | Attending: Internal Medicine | Admitting: Internal Medicine

## 2021-04-11 ENCOUNTER — Other Ambulatory Visit: Payer: Self-pay

## 2021-04-11 DIAGNOSIS — R059 Cough, unspecified: Secondary | ICD-10-CM | POA: Insufficient documentation

## 2021-04-11 DIAGNOSIS — J029 Acute pharyngitis, unspecified: Secondary | ICD-10-CM | POA: Insufficient documentation

## 2021-04-11 DIAGNOSIS — H6692 Otitis media, unspecified, left ear: Secondary | ICD-10-CM | POA: Diagnosis not present

## 2021-04-11 DIAGNOSIS — Z20822 Contact with and (suspected) exposure to covid-19: Secondary | ICD-10-CM | POA: Insufficient documentation

## 2021-04-11 DIAGNOSIS — R6889 Other general symptoms and signs: Secondary | ICD-10-CM | POA: Diagnosis not present

## 2021-04-11 LAB — RESP PANEL BY RT-PCR (FLU A&B, COVID) ARPGX2
Influenza A by PCR: NEGATIVE
Influenza B by PCR: NEGATIVE
SARS Coronavirus 2 by RT PCR: NEGATIVE

## 2021-04-11 MED ORDER — AMOXICILLIN-POT CLAVULANATE 600-42.9 MG/5ML PO SUSR: ORAL | 0 refills | Status: DC

## 2021-04-11 NOTE — ED Triage Notes (Signed)
Patient presents to Urgent Care with complaints of chest congestion, fever, cough, body aches since 11/09. Pt states the medications prescribed did not relieve or help improve his symptoms.

## 2021-04-11 NOTE — ED Provider Notes (Signed)
[facility]    CSN: [id number] Arrival date & time: 04/11/21  1013      History   Chief Complaint Chief Complaint  Patient presents with   Cough   Nasal Congestion   Generalized Body Aches    HPI Rodney Callahan is a 25 y.o. male presents with unresolved cough since 11/2. Yesterday was having L ear pain and his ST feels worse. Mother states yesterday he felt he had a high fever when she palpated him and looked like he felt terrible.  His mother had Covid and his sister had the Flu. Pt states he started feeling worse 2 days ago with HA, chills and fatigue.     Past Medical History:  Diagnosis Date   Amblyopia of left eye    Astigmatism    DVD (dissociated vertical deviation)    Right Eye   Exotropia    Foreign body of esophagus    Myopia    Nystagmus    Ptosis of left eyelid     There are no problems to display for this patient.   Past Surgical History:  Procedure Laterality Date   dysphagia     ESOPHAGOGASTRODUODENOSCOPY N/A 11/21/2015   Procedure: ESOPHAGOGASTRODUODENOSCOPY (EGD);  Surgeon: Scot Jun, MD;  Location: [facility] ENDOSCOPY;  Service: Endoscopy;  Laterality: N/A;   ESOPHAGOGASTRODUODENOSCOPY (EGD) WITH PROPOFOL N/A 01/03/2015   Procedure: ESOPHAGOGASTRODUODENOSCOPY (EGD) WITH PROPOFOL;  Surgeon: Scot Jun, MD;  Location: [facility] ENDOSCOPY;  Service: Endoscopy;  Laterality: N/A;   ESOPHAGOGASTRODUODENOSCOPY (EGD) WITH PROPOFOL N/A 04/29/2015   Procedure: ESOPHAGOGASTRODUODENOSCOPY (EGD) WITH PROPOFOL;  Surgeon: Scot Jun, MD;  Location: [facility] ENDOSCOPY;  Service: Endoscopy;  Laterality: N/A;   ESOPHAGOGASTRODUODENOSCOPY (EGD) WITH PROPOFOL N/A 02/10/2016   Procedure: ESOPHAGOGASTRODUODENOSCOPY (EGD) WITH PROPOFOL;  Surgeon: Scot Jun, MD;  Location: [facility] ENDOSCOPY;  Service: Endoscopy;  Laterality: N/A;   EYE SURGERY     eyelid surgery; frontalis suspension   EYE SURGERY     strabismus eye surgery; bilat; lateral  rectus recession   EYE SURGERY     strabismus eye surgery; bilat; superior recuts recessions - OU; medial rectus resection - OS   EYE SURGERY     strabismus eye surgery; Left; inferior oblique anteriorization w/dissection of scar tissue   FRONTALIS SUSPENSION     SAVORY DILATION N/A 04/29/2015   Procedure: SAVORY DILATION;  Surgeon: Scot Jun, MD;  Location: [facility] ENDOSCOPY;  Service: Endoscopy;  Laterality: N/A;   STRABISMUS SURGERY     TONSILLECTOMY         Home Medications    Prior to Admission medications   Medication Sig Start Date End Date Taking? Authorizing Provider  amoxicillin-clavulanate (AUGMENTIN) 600-42.9 MG/5ML suspension 10 ml bid x 10 days 04/11/21  Yes Rodriguez-Southworth, Nettie Elm, PA-C  benzonatate (TESSALON) 100 MG capsule Take 2 capsules (200 mg total) by mouth every 8 (eight) hours. 03/26/21   Becky Augusta, NP  ipratropium (ATROVENT) 0.06 % nasal spray Place 2 sprays into both nostrils 4 (four) times daily. 03/26/21   Becky Augusta, NP  mupirocin ointment (BACTROBAN) 2 % Apply 1 application topically 2 (two) times daily. 10/25/20   Domenick Gong, MD  promethazine-dextromethorphan (PROMETHAZINE-DM) 6.25-15 MG/5ML syrup Take 5 mLs by mouth 4 (four) times daily as needed. 03/26/21   Becky Augusta, NP  albuterol (VENTOLIN HFA) 108 (90 Base) MCG/ACT inhaler Inhale 1-2 puffs into the lungs every 6 (six) hours as needed for wheezing or shortness of breath. 01/27/19 06/22/19  Domenick Gong, MD  fluticasone (FLONASE) 50 MCG/ACT nasal spray Place 2 sprays into both nostrils daily. 01/27/19 06/22/19  Domenick Gong, MD  omeprazole (PRILOSEC) 40 MG capsule Take 40 mg by mouth daily.  01/27/19  [provider]    Family History Family History  Problem Relation Age of Onset   ADD / ADHD Mother    Osteoarthritis Father     Social History Social History   Tobacco Use   Smoking status: Never   Smokeless tobacco: Never  Vaping Use   Vaping Use: Never used   Substance Use Topics   Alcohol use: Yes    Comment: socially   Drug use: No     Allergies   Other, Acyclovir and related, and Peach flavor   Review of Systems Review of Systems  Constitutional:  Positive for appetite change, chills, fatigue and fever.  HENT:  Positive for congestion, ear pain and sore throat. Negative for ear discharge and trouble swallowing.   Eyes:  Negative for discharge.  Respiratory:  Positive for cough. Negative for shortness of breath and wheezing.   Musculoskeletal:  Negative for myalgias.  Skin:  Negative for rash.  Neurological:  Positive for headaches.  Hematological:  Negative for adenopathy.    Physical Exam Triage Vital Signs ED Triage Vitals  Enc Vitals Group     BP 04/11/21 1221 140/86     Pulse Rate 04/11/21 1221 87     Resp 04/11/21 1221 16     Temp 04/11/21 1221 98.4 F (36.9 C)     Temp Source 04/11/21 1221 Oral     SpO2 04/11/21 1221 98 %     Weight --      Height --      Head Circumference --      Peak Flow --      Pain Score 04/11/21 1220 8     Pain Loc --      Pain Edu? --      Excl. in GC? --    No data found.  Updated Vital Signs BP 140/86 (BP Location: Left Arm)   Pulse 87   Temp 98.4 F (36.9 C) (Oral)   Resp 16   SpO2 98%   Visual Acuity Right Eye Distance:   Left Eye Distance:   Bilateral Distance:    Right Eye Near:   Left Eye Near:    Bilateral Near:     Physical Exam Vitals and nursing note reviewed.  Constitutional:      General: He is not in acute distress.    Appearance: He is obese. He is not toxic-appearing.  HENT:     Head: Normocephalic.     Right Ear: Hearing, tympanic membrane, ear canal and external ear normal.     Left Ear: Hearing normal. Tympanic membrane is erythematous.     Nose: Congestion present.     Mouth/Throat:     Mouth: Mucous membranes are moist.     Pharynx: Posterior oropharyngeal erythema present.     Tonsils: 0 on the right. 0 on the left.     Comments: No  exudate, uvula is mid line Eyes:     General: No scleral icterus.    Conjunctiva/sclera: Conjunctivae normal.  Cardiovascular:     Rate and Rhythm: Normal rate and regular rhythm.     Heart sounds: No murmur heard. Pulmonary:     Effort: Pulmonary effort is normal.     Breath sounds: Normal breath sounds. No wheezing, rhonchi or rales.  Musculoskeletal:  General: Normal range of motion.     Cervical back: Neck supple. No rigidity.  Lymphadenopathy:     Cervical: Cervical adenopathy present.  Skin:    General: Skin is warm and dry.  Neurological:     Mental Status: He is alert and oriented to person, place, and time.     Gait: Gait normal.  Psychiatric:        Mood and Affect: Mood normal.        Behavior: Behavior normal.        Thought Content: Thought content normal.        Judgment: Judgment normal.     UC Treatments / Results  Labs (all labs ordered are listed, but only abnormal results are displayed) Labs Reviewed  RESP PANEL BY RT-PCR (FLU A&B, COVID) [id number]  Neg FluA&B and covid tests   EKG   Radiology No results found.  Procedures Procedures (including critical care time)  Medications Ordered in UC Medications - No data to display  Initial Impression / Assessment and Plan / UC Course  I have reviewed the triage vital signs and the nursing notes. Has L OM, pharyngitis  I placed him on Augmentin as noted      Final Clinical Impressions(s) / UC Diagnoses   Final diagnoses:  Acute otitis media in pediatric patient, left  Flu-like symptoms  Acute pharyngitis, unspecified etiology   Discharge Instructions   None    ED Prescriptions     Medication Sig Dispense Auth. Provider   amoxicillin-clavulanate (AUGMENTIN) 600-42.9 MG/5ML suspension 10 ml bid x 10 days 200 mL Rodriguez-Southworth, Nettie Elm, PA-C      PDMP not reviewed this encounter.   Garey Ham, PA-C 04/11/21 1545

## 2022-03-17 ENCOUNTER — Encounter
Admission: EM | Disposition: A | Discharge status: Home / Self Care | Payer: Self-pay | Source: Home / Self Care | Attending: Emergency Medicine

## 2022-03-17 ENCOUNTER — Observation Stay: Payer: Worker's Compensation | Admitting: Anesthesiology

## 2022-03-17 ENCOUNTER — Observation Stay
Admission: EM | Admit: 2022-03-17 | Discharge: 2022-03-19 | Disposition: A | Discharge status: Home / Self Care | Payer: BC Managed Care – PPO | Attending: Osteopathic Medicine | Admitting: Osteopathic Medicine

## 2022-03-17 ENCOUNTER — Other Ambulatory Visit: Payer: Self-pay

## 2022-03-17 ENCOUNTER — Emergency Department: Payer: Worker's Compensation

## 2022-03-17 DIAGNOSIS — Z79899 Other long term (current) drug therapy: Secondary | ICD-10-CM | POA: Insufficient documentation

## 2022-03-17 DIAGNOSIS — Z8709 Personal history of other diseases of the respiratory system: Secondary | ICD-10-CM

## 2022-03-17 DIAGNOSIS — R030 Elevated blood-pressure reading, without diagnosis of hypertension: Secondary | ICD-10-CM | POA: Diagnosis not present

## 2022-03-17 DIAGNOSIS — Y990 Civilian activity done for income or pay: Secondary | ICD-10-CM | POA: Diagnosis not present

## 2022-03-17 DIAGNOSIS — Z8719 Personal history of other diseases of the digestive system: Secondary | ICD-10-CM

## 2022-03-17 DIAGNOSIS — S91312A Laceration without foreign body, left foot, initial encounter: Secondary | ICD-10-CM | POA: Diagnosis not present

## 2022-03-17 DIAGNOSIS — Z23 Encounter for immunization: Secondary | ICD-10-CM | POA: Insufficient documentation

## 2022-03-17 DIAGNOSIS — E66813 Obesity, class 3: Secondary | ICD-10-CM

## 2022-03-17 DIAGNOSIS — W232XXA Caught, crushed, jammed or pinched between a moving and stationary object, initial encounter: Secondary | ICD-10-CM | POA: Insufficient documentation

## 2022-03-17 DIAGNOSIS — S9782XA Crushing injury of left foot, initial encounter: Secondary | ICD-10-CM | POA: Diagnosis not present

## 2022-03-17 DIAGNOSIS — J45909 Unspecified asthma, uncomplicated: Secondary | ICD-10-CM | POA: Diagnosis not present

## 2022-03-17 HISTORY — PX: IRRIGATION AND DEBRIDEMENT FOOT: SHX6602

## 2022-03-17 LAB — CBC
HCT: 46 % (ref 39.0–52.0)
Hemoglobin: 14.7 g/dL (ref 13.0–17.0)
MCH: 24.5 pg — ABNORMAL LOW (ref 26.0–34.0)
MCHC: 32 g/dL (ref 30.0–36.0)
MCV: 76.5 fL — ABNORMAL LOW (ref 80.0–100.0)
Platelets: 303 10*3/uL (ref 150–400)
RBC: 6.01 MIL/uL — ABNORMAL HIGH (ref 4.22–5.81)
RDW: 15.3 % (ref 11.5–15.5)
WBC: 7.8 10*3/uL (ref 4.0–10.5)
nRBC: 0 % (ref 0.0–0.2)

## 2022-03-17 LAB — BASIC METABOLIC PANEL
Anion gap: 6 (ref 5–15)
BUN: 13 mg/dL (ref 6–20)
CO2: 28 mmol/L (ref 22–32)
Calcium: 9.3 mg/dL (ref 8.9–10.3)
Chloride: 106 mmol/L (ref 98–111)
Creatinine, Ser: 1.05 mg/dL (ref 0.61–1.24)
GFR, Estimated: >60 mL/min (ref >60)
Glucose, Bld: 99 mg/dL (ref 70–99)
Potassium: 3.8 mmol/L (ref 3.5–5.1)
Sodium: 140 mmol/L (ref 135–145)

## 2022-03-17 SURGERY — IRRIGATION AND DEBRIDEMENT FOOT
Anesthesia: General | Site: Foot | Laterality: Left

## 2022-03-17 MED ORDER — ROCURONIUM BROMIDE 100 MG/10ML IV SOLN
INTRAVENOUS | Status: DC | PRN
  Administered 2022-03-17: 40 mg via INTRAVENOUS

## 2022-03-17 MED ORDER — OXYCODONE-ACETAMINOPHEN 5-325 MG PO TABS
2.0000 | ORAL_TABLET | Freq: Once | ORAL | Status: AC
  Administered 2022-03-17: 2 via ORAL
  Filled 2022-03-17: qty 2

## 2022-03-17 MED ORDER — TETANUS-DIPHTH-ACELL PERTUSSIS 5-2.5-18.5 LF-MCG/0.5 IM SUSY
0.5000 mL | PREFILLED_SYRINGE | Freq: Once | INTRAMUSCULAR | Status: AC
  Administered 2022-03-17: 0.5 mL via INTRAMUSCULAR
  Filled 2022-03-17: qty 0.5

## 2022-03-17 MED ORDER — CEFAZOLIN SODIUM-DEXTROSE 2-4 GM/100ML-% IV SOLN
2.0000 g | Freq: Once | INTRAVENOUS | Status: AC
  Administered 2022-03-17: 2 g via INTRAVENOUS
  Filled 2022-03-17: qty 100

## 2022-03-17 MED ORDER — ACETAMINOPHEN 500 MG PO TABS: 1000.0000 mg | ORAL_TABLET | Freq: Once | ORAL | Status: AC

## 2022-03-17 MED ORDER — LIDOCAINE HCL (PF) 2 % IJ SOLN
INTRAMUSCULAR | Status: AC
  Filled 2022-03-17: qty 5

## 2022-03-17 MED ORDER — ROCURONIUM BROMIDE 10 MG/ML (PF) SYRINGE
PREFILLED_SYRINGE | INTRAVENOUS | Status: AC
  Filled 2022-03-17: qty 10

## 2022-03-17 MED ORDER — BUPIVACAINE HCL (PF) 0.5 % IJ SOLN
INTRAMUSCULAR | Status: DC | PRN
  Administered 2022-03-17: 10 mL
  Administered 2022-03-17: 20 mL

## 2022-03-17 MED ORDER — SUGAMMADEX SODIUM 500 MG/5ML IV SOLN
INTRAVENOUS | Status: DC | PRN
  Administered 2022-03-17: 400 mg via INTRAVENOUS
  Administered 2022-03-17: 100 mg via INTRAVENOUS

## 2022-03-17 MED ORDER — ONDANSETRON HCL 4 MG/2ML IJ SOLN: 4.0000 mg | Freq: Four times a day (QID) | INTRAMUSCULAR | Status: DC | PRN

## 2022-03-17 MED ORDER — MIDAZOLAM HCL 2 MG/2ML IJ SOLN
INTRAMUSCULAR | Status: DC | PRN
  Administered 2022-03-17: 4 mg via INTRAVENOUS

## 2022-03-17 MED ORDER — FENTANYL CITRATE (PF) 100 MCG/2ML IJ SOLN: 25.0000 ug | INTRAMUSCULAR | Status: DC | PRN

## 2022-03-17 MED ORDER — FENTANYL CITRATE (PF) 100 MCG/2ML IJ SOLN
INTRAMUSCULAR | Status: DC | PRN
  Administered 2022-03-17: 100 ug via INTRAVENOUS

## 2022-03-17 MED ORDER — PROPOFOL 1000 MG/100ML IV EMUL
INTRAVENOUS | Status: AC
  Filled 2022-03-17: qty 100

## 2022-03-17 MED ORDER — ACETAMINOPHEN 325 MG PO TABS: 650.0000 mg | ORAL_TABLET | Freq: Four times a day (QID) | ORAL | Status: DC | PRN

## 2022-03-17 MED ORDER — LIDOCAINE HCL (PF) 1 % IJ SOLN
INTRAMUSCULAR | Status: AC
  Filled 2022-03-17: qty 30

## 2022-03-17 MED ORDER — CHLORHEXIDINE GLUCONATE 4 % EX LIQD: 60.0000 mL | Freq: Once | CUTANEOUS | Status: DC

## 2022-03-17 MED ORDER — SUGAMMADEX SODIUM 500 MG/5ML IV SOLN
INTRAVENOUS | Status: AC
  Filled 2022-03-17: qty 5

## 2022-03-17 MED ORDER — MIDAZOLAM HCL 2 MG/2ML IJ SOLN
INTRAMUSCULAR | Status: AC
  Filled 2022-03-17: qty 2

## 2022-03-17 MED ORDER — CELECOXIB 200 MG PO CAPS
200.0000 mg | ORAL_CAPSULE | Freq: Once | ORAL | Status: AC
  Filled 2022-03-17: qty 1

## 2022-03-17 MED ORDER — ACETAMINOPHEN 10 MG/ML IV SOLN: 1000.0000 mg | Freq: Once | INTRAVENOUS | Status: DC | PRN

## 2022-03-17 MED ORDER — DEXAMETHASONE SODIUM PHOSPHATE 10 MG/ML IJ SOLN
INTRAMUSCULAR | Status: DC | PRN
  Administered 2022-03-17: 10 mg via INTRAVENOUS

## 2022-03-17 MED ORDER — BUPIVACAINE HCL (PF) 0.5 % IJ SOLN
INTRAMUSCULAR | Status: AC
  Filled 2022-03-17: qty 30

## 2022-03-17 MED ORDER — LIDOCAINE HCL (CARDIAC) PF 100 MG/5ML IV SOSY
PREFILLED_SYRINGE | INTRAVENOUS | Status: DC | PRN
  Administered 2022-03-17: 100 mg via INTRAVENOUS

## 2022-03-17 MED ORDER — KETOROLAC TROMETHAMINE 30 MG/ML IJ SOLN: 30.0000 mg | Freq: Four times a day (QID) | INTRAMUSCULAR | Status: DC | PRN

## 2022-03-17 MED ORDER — OXYCODONE HCL 5 MG/5ML PO SOLN: 5.0000 mg | Freq: Once | ORAL | Status: DC | PRN

## 2022-03-17 MED ORDER — ONDANSETRON HCL 4 MG/2ML IJ SOLN
4.0000 mg | Freq: Once | INTRAMUSCULAR | Status: AC
  Administered 2022-03-17: 4 mg via INTRAVENOUS
  Filled 2022-03-17: qty 2

## 2022-03-17 MED ORDER — ALBUTEROL SULFATE (2.5 MG/3ML) 0.083% IN NEBU: 2.5000 mg | INHALATION_SOLUTION | Freq: Four times a day (QID) | RESPIRATORY_TRACT | Status: DC | PRN

## 2022-03-17 MED ORDER — MORPHINE SULFATE (PF) 4 MG/ML IV SOLN
4.0000 mg | Freq: Once | INTRAVENOUS | Status: AC
  Administered 2022-03-17: 4 mg via INTRAVENOUS
  Filled 2022-03-17: qty 1

## 2022-03-17 MED ORDER — PROMETHAZINE HCL 25 MG/ML IJ SOLN: 6.2500 mg | INTRAMUSCULAR | Status: DC | PRN

## 2022-03-17 MED ORDER — PROPOFOL 10 MG/ML IV BOLUS
INTRAVENOUS | Status: DC | PRN
  Administered 2022-03-17: 50 mg via INTRAVENOUS
  Administered 2022-03-17: 250 mg via INTRAVENOUS

## 2022-03-17 MED ORDER — DROPERIDOL 2.5 MG/ML IJ SOLN: 0.6250 mg | Freq: Once | INTRAMUSCULAR | Status: DC | PRN

## 2022-03-17 MED ORDER — CELECOXIB 200 MG PO CAPS
ORAL_CAPSULE | ORAL | Status: AC
  Administered 2022-03-17: 200 mg via ORAL
  Filled 2022-03-17: qty 1

## 2022-03-17 MED ORDER — ACETAMINOPHEN 500 MG PO TABS
ORAL_TABLET | ORAL | Status: AC
  Administered 2022-03-17: 1000 mg via ORAL
  Filled 2022-03-17: qty 2

## 2022-03-17 MED ORDER — ONDANSETRON HCL 4 MG PO TABS: 4.0000 mg | ORAL_TABLET | Freq: Four times a day (QID) | ORAL | Status: DC | PRN

## 2022-03-17 MED ORDER — ONDANSETRON HCL 4 MG/2ML IJ SOLN
INTRAMUSCULAR | Status: DC | PRN
  Administered 2022-03-17: 4 mg via INTRAVENOUS

## 2022-03-17 MED ORDER — HYDROCODONE-ACETAMINOPHEN 5-325 MG PO TABS
1.0000 | ORAL_TABLET | ORAL | Status: DC | PRN
  Administered 2022-03-18 – 2022-03-19 (×6): 2 via ORAL
  Filled 2022-03-17 (×6): qty 2

## 2022-03-17 MED ORDER — ACETAMINOPHEN 650 MG RE SUPP: 650.0000 mg | Freq: Four times a day (QID) | RECTAL | Status: DC | PRN

## 2022-03-17 MED ORDER — OXYCODONE HCL 5 MG PO TABS: 5.0000 mg | ORAL_TABLET | Freq: Once | ORAL | Status: DC | PRN

## 2022-03-17 MED ORDER — SODIUM CHLORIDE 0.9 % IR SOLN
Status: DC | PRN
  Administered 2022-03-17: 3000 mL

## 2022-03-17 MED ORDER — LACTATED RINGERS IV SOLN: INTRAVENOUS | Status: DC | PRN

## 2022-03-17 MED ORDER — FENTANYL CITRATE (PF) 100 MCG/2ML IJ SOLN
INTRAMUSCULAR | Status: AC
  Filled 2022-03-17: qty 2

## 2022-03-17 MED ORDER — CEFAZOLIN SODIUM-DEXTROSE 2-4 GM/100ML-% IV SOLN
2.0000 g | Freq: Three times a day (TID) | INTRAVENOUS | Status: DC
  Administered 2022-03-18 – 2022-03-19 (×5): 2 g via INTRAVENOUS
  Filled 2022-03-17 (×5): qty 100

## 2022-03-17 MED ORDER — SUCCINYLCHOLINE CHLORIDE 200 MG/10ML IV SOSY
PREFILLED_SYRINGE | INTRAVENOUS | Status: DC | PRN
  Administered 2022-03-17: 180 mg via INTRAVENOUS

## 2022-03-17 SURGICAL SUPPLY — 65 items
APL PRP STRL LF DISP 70% ISPRP (MISCELLANEOUS) ×1
BAG COUNTER SPONGE SURGICOUNT (BAG) ×1 IMPLANT
BAG SPNG CNTER NS LX DISP (BAG) ×1
BLADE OSC/SAGITTAL MD 5.5X18 (BLADE) IMPLANT
BLADE OSCILLATING/SAGITTAL (BLADE)
BLADE SW THK.38XMED LNG THN (BLADE) IMPLANT
BNDG CMPR 75X21 PLY HI ABS (MISCELLANEOUS)
BNDG CMPR STD VLCR NS LF 5.8X3 (GAUZE/BANDAGES/DRESSINGS)
BNDG CMPR STD VLCR NS LF 5.8X4 (GAUZE/BANDAGES/DRESSINGS) ×2
BNDG ELASTIC 3X5.8 VLCR NS LF (GAUZE/BANDAGES/DRESSINGS) IMPLANT
BNDG ELASTIC 4X5.8 VLCR NS LF (GAUZE/BANDAGES/DRESSINGS) IMPLANT
BNDG ELASTIC 4X5.8 VLCR STR LF (GAUZE/BANDAGES/DRESSINGS) IMPLANT
BNDG ESMARK 4X12 TAN STRL LF (GAUZE/BANDAGES/DRESSINGS) ×1 IMPLANT
BNDG GAUZE DERMACEA FLUFF 4 (GAUZE/BANDAGES/DRESSINGS) IMPLANT
BNDG GZE 12X3 1 PLY HI ABS (GAUZE/BANDAGES/DRESSINGS)
BNDG GZE DERMACEA 4 6PLY (GAUZE/BANDAGES/DRESSINGS) ×1
BNDG STRETCH GAUZE 3IN X12FT (GAUZE/BANDAGES/DRESSINGS) IMPLANT
BOOT STEPPER DURA XLG (SOFTGOODS) IMPLANT
CHLORAPREP W/TINT 26 (MISCELLANEOUS) IMPLANT
DRSG XEROFORM 1X8 (GAUZE/BANDAGES/DRESSINGS) IMPLANT
DURAPREP 26ML APPLICATOR (WOUND CARE) ×1 IMPLANT
ELECT REM PT RETURN 9FT ADLT (ELECTROSURGICAL) ×1
ELECTRODE REM PT RTRN 9FT ADLT (ELECTROSURGICAL) ×1 IMPLANT
GAUZE PACKING 0.25INX5YD STRL (GAUZE/BANDAGES/DRESSINGS) IMPLANT
GAUZE SPONGE 4X4 12PLY STRL (GAUZE/BANDAGES/DRESSINGS) ×1 IMPLANT
GAUZE STRETCH 2X75IN STRL (MISCELLANEOUS) IMPLANT
GAUZE XEROFORM 1X8 LF (GAUZE/BANDAGES/DRESSINGS) ×1 IMPLANT
GLOVE BIO SURGEON STRL SZ7 (GLOVE) ×1 IMPLANT
GLOVE SURG UNDER LTX SZ7.5 (GLOVE) ×1 IMPLANT
GOWN STRL REUS W/ TWL LRG LVL3 (GOWN DISPOSABLE) ×2 IMPLANT
GOWN STRL REUS W/TWL LRG LVL3 (GOWN DISPOSABLE) ×2
IV NS 1000ML (IV SOLUTION)
IV NS 1000ML BAXH (IV SOLUTION) IMPLANT
IV NS IRRIG 3000ML ARTHROMATIC (IV SOLUTION) IMPLANT
KIT TURNOVER KIT A (KITS) ×1 IMPLANT
LABEL OR SOLS (LABEL) IMPLANT
MANIFOLD NEPTUNE II (INSTRUMENTS) ×1 IMPLANT
NDL HYPO 25X1 1.5 SAFETY (NEEDLE) ×2 IMPLANT
NEEDLE HYPO 25X1 1.5 SAFETY (NEEDLE) ×2 IMPLANT
NS IRRIG 500ML POUR BTL (IV SOLUTION) ×1 IMPLANT
PACK EXTREMITY ARMC (MISCELLANEOUS) ×1 IMPLANT
PACKING GAUZE IODOFORM 1INX5YD (GAUZE/BANDAGES/DRESSINGS) IMPLANT
PAD ABD DERMACEA PRESS 5X9 (GAUZE/BANDAGES/DRESSINGS) IMPLANT
PAD CAST 4YDX4 CTTN HI CHSV (CAST SUPPLIES) IMPLANT
PAD CAST CTTN 4X4 STRL (SOFTGOODS) IMPLANT
PADDING CAST COTTON 4X4 STRL (CAST SUPPLIES) ×1
PADDING CAST COTTON 4X4 STRL (SOFTGOODS) ×1
PULSAVAC PLUS IRRIG FAN TIP (DISPOSABLE) ×1
RASP SM TEAR CROSS CUT (RASP) IMPLANT
SOL PREP PVP 2OZ (MISCELLANEOUS)
SOLUTION PREP PVP 2OZ (MISCELLANEOUS) IMPLANT
STOCKINETTE STRL 6IN 960660 (GAUZE/BANDAGES/DRESSINGS) ×1 IMPLANT
SUT ETHILON 3-0 FS-10 30 BLK (SUTURE) ×2
SUT ETHILON 4-0 (SUTURE)
SUT ETHILON 4-0 FS2 18XMFL BLK (SUTURE)
SUT VIC AB 3-0 SH 27 (SUTURE) ×2
SUT VIC AB 3-0 SH 27X BRD (SUTURE) IMPLANT
SUT VIC AB 4-0 FS2 27 (SUTURE) IMPLANT
SUTURE EHLN 3-0 FS-10 30 BLK (SUTURE) IMPLANT
SUTURE ETHLN 4-0 FS2 18XMF BLK (SUTURE) IMPLANT
SWAB CULTURE AMIES ANAERIB BLU (MISCELLANEOUS) IMPLANT
SYR 10ML LL (SYRINGE) ×1 IMPLANT
TIP FAN IRRIG PULSAVAC PLUS (DISPOSABLE) IMPLANT
TRAP FLUID SMOKE EVACUATOR (MISCELLANEOUS) ×1 IMPLANT
WATER STERILE IRR 500ML POUR (IV SOLUTION) ×1 IMPLANT

## 2022-03-17 NOTE — Assessment & Plan Note (Signed)
SBP in the 160s with no prior diagnosis of HTN Possibly related to pain. continue to monitor

## 2022-03-17 NOTE — ED Provider Notes (Signed)
[facility] Provider Note    Event Date/Time   First MD Initiated Contact with Patient 03/17/22 1723     (approximate)   History   Foot Injury   HPI  Rodney Callahan is a 26 y.o. male with no significant past medical history presents emergency department with a foot injury from work.  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  Patient states that his foot got caught between a forklift.  No other injuries reported.  No numbness or tingling.  Patient states his foot started bleeding a lot.  Has been unable to bear weight.  Unsure of his last Tdap      Physical Exam   Triage Vital Signs: ED Triage Vitals  Enc Vitals Group     BP 03/17/22 1613 (!) 176/80     Pulse Rate 03/17/22 1612 84     Resp 03/17/22 1612 18     Temp 03/17/22 1612 98.1 F (36.7 C)     Temp Source 03/17/22 1612 Oral     SpO2 03/17/22 1612 99 %     Weight 03/17/22 1612 (!) 360 lb (163.3 kg)     Height 03/17/22 1612 6\' 7"  (2.007 m)     Head Circumference --      Peak Flow --      Pain Score 03/17/22 1612 10     Pain Loc --      Pain Edu? --      Excl. in [place]? --     Most recent vital signs: Vitals:   03/17/22 1613 03/17/22 1846  BP: (!) 176/80 (!) 160/78  Pulse:  80  Resp:  18  Temp:    SpO2:  99%     General: Awake, no distress.   CV:  Good peripheral perfusion. regular rate and  rhythm Resp:  Normal effort.  Abd:  No distention.   Other:  Left knee with large laceration, see media in chart, three quarters way around the heel, area is very deep and there is a a lot of fatty tissue exposed, patient is able to dorsiflex and flex foot   ED Results / Procedures / Treatments   Labs (all labs ordered are listed, but only abnormal results are displayed) Labs Reviewed  CBC - Abnormal; Notable for the following components:      Result Value   RBC 6.01 (*)    MCV 76.5 (*)    MCH 24.5 (*)    All other components within normal limits   BASIC METABOLIC PANEL     EKG     RADIOLOGY X-ray of the left foot    PROCEDURES:   Procedures   MEDICATIONS ORDERED IN ED: Medications  oxyCODONE-acetaminophen (PERCOCET/ROXICET) 5-325 MG per tablet 2 tablet (2 tablets Oral Given 03/17/22 1657)  morphine (PF) 4 MG/ML injection 4 mg (4 mg Intravenous Given 03/17/22 1812)  ondansetron (ZOFRAN) injection 4 mg (4 mg Intravenous Given 03/17/22 1811)  ceFAZolin (ANCEF) IVPB 2g/100 mL premix (0 g Intravenous Stopped 03/17/22 1924)  Tdap (BOOSTRIX) injection 0.5 mL (0.5 mLs Intramuscular Given 03/17/22 1851)     IMPRESSION / MDM / [place] / ED COURSE  I reviewed the triage vital signs and the nursing notes.                              Differential diagnosis includes, but is not limited to, laceration, fracture, tendon  laceration, contusion  Patient's presentation is most consistent with acute complicated illness / injury requiring diagnostic workup.   X-ray of the left foot independently reviewed and interpreted by me as being negative for fracture, soft tissue injury noted by radiologist  Due to the extensive laceration and a area that may be getting greatly infected especially since it was in a sneaker we will contact podiatry.  Dr. Sidney Ace in to see the patient Consult to podiatry.    Patient was started on ancef 2g iv, morphine 4mg  for pain, zofran for nausea   Consult with dr , will take the patient to the OR. Keep NPO  Consult to hospitalist for admission, Dr Engineer, production states patient will most likely go home either late tomorrow or Thursday  Spoke with dr Thursday, will be admitting, Pt in stable condition at this time, instructed to stay npo  FINAL CLINICAL IMPRESSION(S) / ED DIAGNOSES   Final diagnoses:  Crushing injury of left foot, initial encounter  Laceration of left foot, initial encounter     Rx / DC Orders   ED Discharge Orders     None        Note:  This document was  prepared using Dragon voice recognition software and may include unintentional dictation errors.    Para March, PA-C 03/17/22 1957    03/19/22, MD 03/17/22 [phone]

## 2022-03-17 NOTE — Consult Note (Addendum)
PODIATRY / FOOT AND ANKLE SURGERY CONSULTATION NOTE  Requesting Physician: Dr. Francine Graven  Reason for consult: Left foot laceration  Chief Complaint: Left foot pain/open wound after crush injury   HPI: Rodney Callahan is a 26 y.o. male who presents with an acute injury in which his left foot got caught between a forklift.  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  He presented directly from work to the emergency room with notable bleeding to the area and a large laceration.  X-ray imaging was taken, no fracture or dislocation was present.  Achilles tendon appeared to be intact based on soft tissue outline of Achilles.  Patient was placed in a compressive dressing.  Podiatry team was consulted for further evaluation due to concern for substantial laceration present as well as depth of laceration.  Patient states that he has last eaten/had something to drink at 1 PM.  Patient currently denies nausea, vomiting, fevers, chills.  PMHx:  Past Medical History:  Diagnosis Date   Amblyopia of left eye    Astigmatism    DVD (dissociated vertical deviation)    Right Eye   Exotropia    Foreign body of esophagus    Myopia    Nystagmus    Ptosis of left eyelid     Surgical Hx:  Past Surgical History:  Procedure Laterality Date   dysphagia     ESOPHAGOGASTRODUODENOSCOPY N/A 11/21/2015   Procedure: ESOPHAGOGASTRODUODENOSCOPY (EGD);  Surgeon: Manya Silvas, MD;  Location: [facility] ENDOSCOPY;  Service: Endoscopy;  Laterality: N/A;   ESOPHAGOGASTRODUODENOSCOPY (EGD) WITH PROPOFOL N/A 01/03/2015   Procedure: ESOPHAGOGASTRODUODENOSCOPY (EGD) WITH PROPOFOL;  Surgeon: Manya Silvas, MD;  Location: [facility] ENDOSCOPY;  Service: Endoscopy;  Laterality: N/A;   ESOPHAGOGASTRODUODENOSCOPY (EGD) WITH PROPOFOL N/A 04/29/2015   Procedure: ESOPHAGOGASTRODUODENOSCOPY (EGD) WITH PROPOFOL;  Surgeon: Manya Silvas, MD;  Location: [facility] ENDOSCOPY;  Service: Endoscopy;  Laterality:  N/A;   ESOPHAGOGASTRODUODENOSCOPY (EGD) WITH PROPOFOL N/A 02/10/2016   Procedure: ESOPHAGOGASTRODUODENOSCOPY (EGD) WITH PROPOFOL;  Surgeon: Manya Silvas, MD;  Location: [facility] ENDOSCOPY;  Service: Endoscopy;  Laterality: N/A;   EYE SURGERY     eyelid surgery; frontalis suspension   EYE SURGERY     strabismus eye surgery; bilat; lateral rectus recession   EYE SURGERY     strabismus eye surgery; bilat; superior recuts recessions - OU; medial rectus resection - OS   EYE SURGERY     strabismus eye surgery; Left; inferior oblique anteriorization w/dissection of scar tissue   FRONTALIS SUSPENSION     SAVORY DILATION N/A 04/29/2015   Procedure: SAVORY DILATION;  Surgeon: Manya Silvas, MD;  Location: [facility] ENDOSCOPY;  Service: Endoscopy;  Laterality: N/A;   STRABISMUS SURGERY     TONSILLECTOMY      FHx:  Family History  Problem Relation Age of Onset   ADD / ADHD Mother    Osteoarthritis Father     Social History:  reports that he has never smoked. He has never used smokeless tobacco. He reports current alcohol use. He reports that he does not use drugs.  Allergies:  Allergies  Allergen Reactions   Other Swelling, Rash and Anaphylaxis    Reactive agents: Jim's Barbeque Sauce & Lemon Pepper LEMON PEPPER   Acyclovir And Related    Peach Flavor Hives   (Not in a hospital admission)   Physical Exam: General: Alert and oriented.  No apparent distress.  Vascular: DP/PT pulses palpable bilateral.  Capillary fill time appears to be  intact to digits bilateral, but somewhat sluggish to plantar heel flap.  Hair growth present to digits bilateral lower extremities.  Neuro: Light touch sensation intact to digits bilaterally.  Derm: Open laceration present to the posterior and plantar aspects of the left heel.  Appears to have fat layer exposed, probes close to tendon/bone, no obvious contamination of wound present.     MSK: Pain on palpation to the left heel  Results for orders  placed or performed during the hospital encounter of 03/17/22 (from the past 48 hour(s))  CBC     Status: Abnormal   Collection Time: 03/17/22  4:16 PM  Result Value Ref Range   WBC 7.8 4.0 - 10.5 K/uL   RBC 6.01 (H) 4.22 - 5.81 MIL/uL   Hemoglobin 14.7 13.0 - 17.0 g/dL   HCT 72.5 36.6 - 44.0 %   MCV 76.5 (L) 80.0 - 100.0 fL   MCH 24.5 (L) 26.0 - 34.0 pg   MCHC 32.0 30.0 - 36.0 g/dL   RDW 34.7 42.5 - 95.6 %   Platelets 303 150 - 400 K/uL   nRBC 0.0 0.0 - 0.2 %    Comment: Performed at [facility], [address] [place], [place] [address]  Basic metabolic panel     Status: None   Collection Time: 03/17/22  4:16 PM  Result Value Ref Range   Sodium 140 135 - 145 mmol/L   Potassium 3.8 3.5 - 5.1 mmol/L   Chloride 106 98 - 111 mmol/L   CO2 28 22 - 32 mmol/L   Glucose, Bld 99 70 - 99 mg/dL    Comment: Glucose reference range applies only to samples taken after fasting for at least 8 hours.   BUN 13 6 - 20 mg/dL   Creatinine, Ser 4.33 0.61 - 1.24 mg/dL   Calcium 9.3 8.9 - 29.5 mg/dL   GFR, Estimated >18 >84 mL/min    Comment: (NOTE) Calculated using the CKD-EPI Creatinine Equation (2021)    Anion gap 6 5 - 15    Comment: Performed at [facility], [address] [place], [place] [address]   DG Foot Complete Left  Result Date: 03/17/2022 CLINICAL DATA:  Foot injury of work. EXAM: LEFT FOOT - COMPLETE 3+ VIEW COMPARISON:  None Available. FINDINGS: Soft tissue wound posterior to the calcaneus involving the heel. There is gas in the soft tissues with laceration and overlying bandage. No fracture identified. No osteomyelitis. Mild degenerative change first MTP. IMPRESSION: Soft tissue wound posterior to the calcaneus. No fracture or osteomyelitis. Electronically Signed   By: Marlan Palau M.D.   On: 03/17/2022 16:52    Blood pressure (!) 160/78, pulse 80, temperature 98.1 F (36.7 C), temperature source Oral, resp. rate 18, height 6\' 7"  (2.007 m), weight (!) 163.3  kg, SpO2 99 %.   Assessment Deep laceration present to the left posterior and plantar heel Left foot crush injury  Plan -Patient seen and examined -X-ray imaging reviewed and discussed with patient in detail.  Do not see any fracture dislocation. -Wound examined, appears to probe fairly deep, almost to the level of tendon/bone.  Appears to have substantial laceration present with large amount of fat layer exposed, wound appears to be clean overall with no contamination. All treatment options were discussed with the patient of both conservative and surgical attempts at correction including potential risks and complications.  Patient has elected for procedure consisting of possible incision and drainage with washout and debridement of wound with closure,  discussed we will also explore the Achilles area to determine if any damage is present and could potentially repair this as well if needed or may have to be staged.  No guarantees given.  Consent obtained. -Patient currently n.p.o. for surgery tonight at 9 PM. -Patient will have to be nonweightbearing after the procedure for 2 to 3 weeks or potentially longer if tendon is involved.  Will place physical therapy/Occupational Therapy order after surgery to start tomorrow. -Appreciate medicine recommendations for pain management. -Patient can be discharged with 7-day course Keflex tomorrow most likely if pain is well controlled and patient is doing well.  Rosetta Posner, DPM 03/17/2022, 8:21 PM

## 2022-03-17 NOTE — H&P (Addendum)
History and Physical    Patient: Rodney Callahan ERX:[id number] DOB: 12-01-1995 DOA: 03/17/2022 DOS: the patient was seen and examined on 03/17/2022 PCP: Manya Silvas, MD (Inactive)  Patient coming from: Home  Chief Complaint:  Chief Complaint  Patient presents with   Foot Injury    HPI: Rodney Callahan is a 26 y.o. male with medical history significant for esophageal stricture, s/p dilatation x3, remote history of wheezing requiring inhalers,Class III obesity who presents to the ED following an injury while at work when his foot got caught between a forklift.  Patient was wearing sneakers at the time.  He sustained a laceration to the posterior foot .  He was able to "hobble" but with increasing difficulty to bear weight.  He was previously in his usual state of health. ED course and data review: BP 176/80 with no prior history of hypertension, with otherwise normal vitals.  CBC and BMP unremarkable. X-ray foot showed soft tissue wound posterior to the calcaneus without fracture or osteomyelitis. The ED provider, Bryson Ha, NP spoke with podiatrist, Dr. Luana Shu who recommended Ancef and admission to hospitalist service with plans to take to the OR tonight.  Hospitalist thus consulted.   Review of Systems: As mentioned in the history of present illness. All other systems reviewed and are negative.  Past Medical History:  Diagnosis Date   Amblyopia of left eye    Astigmatism    DVD (dissociated vertical deviation)    Right Eye   Exotropia    Foreign body of esophagus    Myopia    Nystagmus    Ptosis of left eyelid    Past Surgical History:  Procedure Laterality Date   dysphagia     ESOPHAGOGASTRODUODENOSCOPY N/A 11/21/2015   Procedure: ESOPHAGOGASTRODUODENOSCOPY (EGD);  Surgeon: Manya Silvas, MD;  Location: [facility] ENDOSCOPY;  Service: Endoscopy;  Laterality: N/A;   ESOPHAGOGASTRODUODENOSCOPY (EGD) WITH PROPOFOL N/A 01/03/2015   Procedure:  ESOPHAGOGASTRODUODENOSCOPY (EGD) WITH PROPOFOL;  Surgeon: Manya Silvas, MD;  Location: [facility] ENDOSCOPY;  Service: Endoscopy;  Laterality: N/A;   ESOPHAGOGASTRODUODENOSCOPY (EGD) WITH PROPOFOL N/A 04/29/2015   Procedure: ESOPHAGOGASTRODUODENOSCOPY (EGD) WITH PROPOFOL;  Surgeon: Manya Silvas, MD;  Location: [facility] ENDOSCOPY;  Service: Endoscopy;  Laterality: N/A;   ESOPHAGOGASTRODUODENOSCOPY (EGD) WITH PROPOFOL N/A 02/10/2016   Procedure: ESOPHAGOGASTRODUODENOSCOPY (EGD) WITH PROPOFOL;  Surgeon: Manya Silvas, MD;  Location: [facility] ENDOSCOPY;  Service: Endoscopy;  Laterality: N/A;   EYE SURGERY     eyelid surgery; frontalis suspension   EYE SURGERY     strabismus eye surgery; bilat; lateral rectus recession   EYE SURGERY     strabismus eye surgery; bilat; superior recuts recessions - OU; medial rectus resection - OS   EYE SURGERY     strabismus eye surgery; Left; inferior oblique anteriorization w/dissection of scar tissue   FRONTALIS SUSPENSION     SAVORY DILATION N/A 04/29/2015   Procedure: SAVORY DILATION;  Surgeon: Manya Silvas, MD;  Location: [facility] ENDOSCOPY;  Service: Endoscopy;  Laterality: N/A;   STRABISMUS SURGERY     TONSILLECTOMY     Social History:  reports that he has never smoked. He has never used smokeless tobacco. He reports current alcohol use. He reports that he does not use drugs.  Allergies  Allergen Reactions   Other Swelling, Rash and Anaphylaxis    Reactive agents: Jim's Barbeque Sauce & Lemon Pepper LEMON PEPPER   Acyclovir And Related    Peach Flavor Hives    Family History  Problem  Relation Age of Onset   ADD / ADHD Mother    Osteoarthritis Father     Prior to Admission medications   Medication Sig Start Date End Date Taking? Authorizing Provider  amoxicillin-clavulanate (AUGMENTIN) 600-42.9 MG/5ML suspension 10 ml bid x 10 days 04/11/21   Rodriguez-Southworth, Nettie Elm, PA-C  benzonatate (TESSALON) 100 MG capsule Take 2 capsules (200 mg total) by  mouth every 8 (eight) hours. 03/26/21   Becky Augusta, NP  ipratropium (ATROVENT) 0.06 % nasal spray Place 2 sprays into both nostrils 4 (four) times daily. 03/26/21   Becky Augusta, NP  mupirocin ointment (BACTROBAN) 2 % Apply 1 application topically 2 (two) times daily. 10/25/20   Domenick Gong, MD  promethazine-dextromethorphan (PROMETHAZINE-DM) 6.25-15 MG/5ML syrup Take 5 mLs by mouth 4 (four) times daily as needed. 03/26/21   Becky Augusta, NP  albuterol (VENTOLIN HFA) 108 (90 Base) MCG/ACT inhaler Inhale 1-2 puffs into the lungs every 6 (six) hours as needed for wheezing or shortness of breath. 01/27/19 06/22/19  Domenick Gong, MD  fluticasone (FLONASE) 50 MCG/ACT nasal spray Place 2 sprays into both nostrils daily. 01/27/19 06/22/19  Domenick Gong, MD  omeprazole (PRILOSEC) 40 MG capsule Take 40 mg by mouth daily.  01/27/19  [provider]    Physical Exam: Vitals:   03/17/22 1612 03/17/22 1613 03/17/22 1846  BP:  (!) 176/80 (!) 160/78  Pulse: 84  80  Resp: 18  18  Temp: 98.1 F (36.7 C)    TempSrc: Oral    SpO2: 99%  99%  Weight: (!) 163.3 kg    Height: 6\' 7"  (2.007 m)     Physical Exam Vitals and nursing note reviewed.  Constitutional:      General: He is not in acute distress. HENT:     Head: Normocephalic and atraumatic.  Cardiovascular:     Rate and Rhythm: Normal rate and regular rhythm.     Heart sounds: Normal heart sounds.  Pulmonary:     Effort: Pulmonary effort is normal.     Breath sounds: Normal breath sounds.  Abdominal:     Palpations: Abdomen is soft.     Tenderness: There is no abdominal tenderness.  Musculoskeletal:     Comments: Left heel in bandage. See pic below from earlier  Neurological:     Mental Status: Mental status is at baseline.      Labs on Admission: I have personally reviewed following labs and imaging studies  CBC: Recent Labs  Lab 03/17/22 1616  WBC 7.8  HGB 14.7  HCT 46.0  MCV 76.5*  PLT 303   Basic Metabolic  Panel: Recent Labs  Lab 03/17/22 1616  NA 140  K 3.8  CL 106  CO2 28  GLUCOSE 99  BUN 13  CREATININE 1.05  CALCIUM 9.3   GFR: Estimated Creatinine Clearance: 180 mL/min (by C-G formula based on SCr of 1.05 mg/dL). Liver Function Tests: No results for input(s): "AST", "ALT", "ALKPHOS", "BILITOT", "PROT", "ALBUMIN" in the last 168 hours. No results for input(s): "LIPASE", "AMYLASE" in the last 168 hours. No results for input(s): "AMMONIA" in the last 168 hours. Coagulation Profile: No results for input(s): "INR", "PROTIME" in the last 168 hours. Cardiac Enzymes: No results for input(s): "CKTOTAL", "CKMB", "CKMBINDEX", "TROPONINI" in the last 168 hours. BNP (last 3 results) No results for input(s): "PROBNP" in the last 8760 hours. HbA1C: No results for input(s): "HGBA1C" in the last 72 hours. CBG: No results for input(s): "GLUCAP" in the last 168 hours. Lipid Profile:  No results for input(s): "CHOL", "HDL", "LDLCALC", "TRIG", "CHOLHDL", "LDLDIRECT" in the last 72 hours. Thyroid Function Tests: No results for input(s): "TSH", "T4TOTAL", "FREET4", "T3FREE", "THYROIDAB" in the last 72 hours. Anemia Panel: No results for input(s): "VITAMINB12", "FOLATE", "FERRITIN", "TIBC", "IRON", "RETICCTPCT" in the last 72 hours. Urine analysis: No results found for: "COLORURINE", "APPEARANCEUR", "LABSPEC", "PHURINE", "GLUCOSEU", "HGBUR", "BILIRUBINUR", "KETONESUR", "PROTEINUR", "UROBILINOGEN", "NITRITE", "LEUKOCYTESUR"  Radiological Exams on Admission: DG Foot Complete Left  Result Date: 03/17/2022 CLINICAL DATA:  Foot injury of work. EXAM: LEFT FOOT - COMPLETE 3+ VIEW COMPARISON:  None Available. FINDINGS: Soft tissue wound posterior to the calcaneus involving the heel. There is gas in the soft tissues with laceration and overlying bandage. No fracture identified. No osteomyelitis. Mild degenerative change first MTP. IMPRESSION: Soft tissue wound posterior to the calcaneus. No fracture or  osteomyelitis. Electronically Signed   By: Marlan Palau M.D.   On: 03/17/2022 16:52     Data Reviewed: Relevant notes from primary care and specialist visits, past discharge summaries as available in EHR, including Care Everywhere. Prior diagnostic testing as pertinent to current admission diagnoses Updated medications and problem lists for reconciliation ED course, including vitals, labs, imaging, treatment and response to treatment Triage notes, nursing and pharmacy notes and ED provider's notes Notable results as noted in HPI   Assessment and Plan: * Laceration with crush injury of heel, left, initial encounter Continue daily Ancef prophylaxis pending podiatry Pain control Patient received Tdap in the ED Podiatry consulted Discussed with Dr Excell Seltzer who will take patient to the OR tonight  Elevated blood-pressure reading without diagnosis of hypertension SBP in the 160s with no prior diagnosis of HTN Possibly related to pain. continue to monitor  History of asthma Not currently on inhalers and no wheezing in several years Albuterol prn  History of esophageal stricture/dysphagia with 'ringed esophagus' on EGD 2017 History of dysphagia with food boluses getting stuck S/p dilatation x3, last time in 2017 History obtained from mother at bedside and discussed with nurse anesthetist at patient's bedside who stated patient will receive general anesthesia for his procedure  Obesity, Class III, BMI 40-49.9 (morbid obesity) (HCC) Weight 163.3 kg with height 6 feet 7 inches May complicate recovery/healing        DVT prophylaxis: SCD  Consults: podiatry, Dr Excell Seltzer  Advance Care Planning: full code  Family Communication: mother at bedside  Disposition Plan: Back to previous home environment  Severity of Illness: The appropriate patient status for this patient is OBSERVATION. Observation status is judged to be reasonable and necessary in order to provide the required  intensity of service to ensure the patient's safety. The patient's presenting symptoms, physical exam findings, and initial radiographic and laboratory data in the context of their medical condition is felt to place them at decreased risk for further clinical deterioration. Furthermore, it is anticipated that the patient will be medically stable for discharge from the hospital within 2 midnights of admission.   Author: Andris Baumann, MD 03/17/2022 8:01 PM  For on call review [email or web address].

## 2022-03-17 NOTE — Assessment & Plan Note (Addendum)
History of dysphagia with food boluses getting stuck S/p dilatation x3, last time in 2017 History obtained from mother at bedside and discussed with nurse anesthetist at patient's bedside who stated patient will receive general anesthesia for his procedure

## 2022-03-17 NOTE — Assessment & Plan Note (Addendum)
Weight 163.3 kg with height 6 feet 7 inches May complicate recovery/healing

## 2022-03-17 NOTE — Anesthesia Preprocedure Evaluation (Addendum)
Anesthesia Evaluation  Patient identified by MRN, date of birth, ID band Patient awake    Reviewed: Allergy & Precautions, NPO status , Patient's Chart, lab work & pertinent test results  Airway Mallampati: III  TM Distance: >3 FB Neck ROM: full    Dental  (+) Chipped,    Pulmonary neg pulmonary ROS,    Pulmonary exam normal        Cardiovascular negative cardio ROS Normal cardiovascular exam     Neuro/Psych negative neurological ROS  negative psych ROS   GI/Hepatic negative GI ROS, Neg liver ROS,   Endo/Other  Morbid obesity  Renal/GU negative Renal ROS  negative genitourinary   Musculoskeletal   Abdominal (+) + obese,   Peds  Hematology negative hematology ROS (+)   Anesthesia Other Findings Past Medical History: No date: Amblyopia of left eye No date: Astigmatism No date: 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 No date: Exotropia No date: Foreign body of esophagus No date: Myopia No date: Nystagmus No date: Ptosis of left eyelid  Past Surgical History: No date: dysphagia 11/21/2015: ESOPHAGOGASTRODUODENOSCOPY; N/A     Comment:  Procedure: ESOPHAGOGASTRODUODENOSCOPY (EGD);  Surgeon:               Manya Silvas, MD;  Location: [facility] ENDOSCOPY;                Service: Endoscopy;  Laterality: N/A; 01/03/2015: ESOPHAGOGASTRODUODENOSCOPY (EGD) WITH PROPOFOL; N/A     Comment:  Procedure: ESOPHAGOGASTRODUODENOSCOPY (EGD) WITH               PROPOFOL;  Surgeon: Manya Silvas, MD;  Location: [facility]              ENDOSCOPY;  Service: Endoscopy;  Laterality: N/A; 04/29/2015: ESOPHAGOGASTRODUODENOSCOPY (EGD) WITH PROPOFOL; N/A     Comment:  Procedure: ESOPHAGOGASTRODUODENOSCOPY (EGD) WITH               PROPOFOL;  Surgeon: Manya Silvas, MD;  Location: [facility]              ENDOSCOPY;  Service: Endoscopy;  Laterality: N/A; 02/10/2016: ESOPHAGOGASTRODUODENOSCOPY (EGD) WITH PROPOFOL; N/A      Comment:  Procedure: ESOPHAGOGASTRODUODENOSCOPY (EGD) WITH               PROPOFOL;  Surgeon: Manya Silvas, MD;  Location: [facility]              ENDOSCOPY;  Service: Endoscopy;  Laterality: N/A; No date: EYE SURGERY     Comment:  eyelid surgery; frontalis suspension No date: EYE SURGERY     Comment:  strabismus eye surgery; bilat; lateral rectus recession No date: EYE SURGERY     Comment:  strabismus eye surgery; bilat; superior recuts               recessions - OU; medial rectus resection - OS No date: EYE SURGERY     Comment:  strabismus eye surgery; Left; inferior oblique               anteriorization w/dissection of scar tissue No date: FRONTALIS SUSPENSION 04/29/2015: SAVORY DILATION; N/A     Comment:  Procedure: SAVORY DILATION;  Surgeon: Manya Silvas,               MD;  Location: [facility] ENDOSCOPY;  Service: Endoscopy;                Laterality: N/A; No date: STRABISMUS SURGERY No date: TONSILLECTOMY  BMI    Body Mass Index: 40.56 kg/m      Reproductive/Obstetrics negative OB ROS                            Anesthesia Physical Anesthesia Plan  ASA: 3  Anesthesia Plan: General   Post-op Pain Management: Regional block*, Toradol IV (intra-op)* and Ofirmev IV (intra-op)*   Induction: Intravenous  PONV Risk Score and Plan: 2 and Midazolam, Dexamethasone and Ondansetron  Airway Management Planned: Oral ETT  Additional Equipment:   Intra-op Plan:   Post-operative Plan: Extubation in OR  Informed Consent: I have reviewed the patients History and Physical, chart, labs and discussed the procedure including the risks, benefits and alternatives for the proposed anesthesia with the patient or authorized representative who has indicated his/her understanding and acceptance.     Dental advisory given  Plan Discussed with: Anesthesiologist, CRNA and Surgeon  Anesthesia Plan Comments:        Anesthesia Quick Evaluation

## 2022-03-17 NOTE — Assessment & Plan Note (Signed)
Not currently on inhalers and no wheezing in several years Albuterol prn

## 2022-03-17 NOTE — ED Triage Notes (Signed)
Pt to ED via POV from work. Pt smashed his left foot on a lift at work. Pt with bleeding to posterior left heal. Pt has been able to "hobble" after injury. Pt has deep wound to posterior food with fatty tissue exposure. PA in triage to unwrap and re-wrap wound. Pt denies blood thinner.   Pt works for [employer] and will be WC.

## 2022-03-17 NOTE — ED Provider Triage Note (Signed)
  Emergency Medicine Provider Triage Evaluation Note  [facility] , a 26 y.o.male,  was evaluated in triage.  Pt complains of left foot injury.  He states that the back of his left foot around the posterior heel was caught between a rail and a lifting device, causing significant laceration to the foot.  Denies any other injuries.  Denies blood thinner use.   Review of Systems  Positive: Left foot laceration. Negative: Denies fever, chest pain, vomiting  Physical Exam   Vitals:   03/17/22 1612 03/17/22 1613  BP:  (!) 176/80  Pulse: 84   Resp: 18   Temp: 98.1 F (36.7 C)   SpO2: 99%    Gen:   Awake, in pain. Resp:  Normal effort  MSK:   Moves extremities without difficulty  Other:  Deep, 5-7 cm long laceration along the heel with exposed fatty tissue.  Medical Decision Making  Given the patient's initial medical screening exam, the following diagnostic evaluation has been ordered. The patient will be placed in the appropriate treatment space, once one is available, to complete the evaluation and treatment. I have discussed the plan of care with the patient and I have advised the patient that an ED physician or mid-level practitioner will reevaluate their condition after the test results have been received, as the results may give them additional insight into the type of treatment they may need.    Diagnostics: Left foot x-ray, labs  Treatments: Oxycodone/acetaminophen.   Teodoro Spray, [place] 03/17/22 1620

## 2022-03-17 NOTE — Op Note (Signed)
PODIATRY / FOOT AND ANKLE SURGERY OPERATIVE REPORT    SURGEON: Rosetta Posner, DPM  PRE-OPERATIVE DIAGNOSIS:  1.  Left foot crush injury 2.  Left foot laceration measures 14 cm x 1.5 cm x 2.5 cm, somewhat sluggish capillary fill time to flap  POST-OPERATIVE DIAGNOSIS: Same  PROCEDURE(S): Left foot incision and drainage with debridement of nonviable necrotic tissue Left foot laceration repair  HEMOSTASIS: No tourniquet  ANESTHESIA: general  ESTIMATED BLOOD LOSS: 20 cc  FINDING(S): 1.  Left posterior/plantar heel laceration measures 14 cm x 1.5 cm x 2.5 cm 2.  Lateral skin island intact measuring approximately 4-1/2 to 5 cm at the lateral heel 3.  No obvious contamination present to tissues upon examination 4.  Achilles tendon appears to be intact as calf squeeze produces plantarflexion and visualization of Achilles tendon shows no tear or rupture.  PATHOLOGY/SPECIMEN(S): None  INDICATIONS:   Rodney Callahan is a 26 y.o. male who presents with a laceration to the posterior aspect the left heel after sustaining a crush type injury in which a forklift landed on his foot and pinned his foot.  Patient went to the emergency room after this and had x-rays taken which did not show any deep pathology present.  Podiatry team was consulted for further evaluation of wound and for surgical treatment.  All treatment options were discussed with patient both conservative and surgical attempts at correction including potential risks and complications at this time patient is elected for surgical intervention consisting of left foot incision and drainage with debridement of nonviable necrotic tissue and left foot laceration repair.  No guarantees given.  Consent obtained prior to procedure.  Discussed with patient that capillary fill time is somewhat sluggish to the area and if this tissue dies patient will likely ultimately need further debridement, graft application, and wound VAC therapy.   Patient understands.  DESCRIPTION: After obtaining full informed written consent, the patient was brought back to the operating room and placed prone upon the operating table.  The patient received IV antibiotics prior to induction.  After obtaining adequate anesthesia, the patient was prepped and draped in the standard fashion.  20 cc of half percent Marcaine plain was injected about the operative site.  Attention was directed to the left posterior heel laceration.  Appeared to have a full-thickness flap.  The skin bridge laterally at the lateral here appeared to be intact and measured approximately 4-1/2 to 5 cm in length.  Capillary fill time once again appeared to be somewhat sluggish overall to the flap at the plantar heel.  Debridement was performed to the whole area which measured approximately 14 cm x 1.5 cm x 2.5 cm with combination of 15 blade and curette.  Mild bleeding occurred during this but did not appear to have optimal bleeding during procedure.  The surgical site was flushed with copious amounts normal sterile saline with the pulse lavage 3 L.  The tissues appeared to be healthy and viable overall.  The Achilles tendon was able to be visualized and did not appear to have any tear present or bony involvement.  Calf squeeze also produce plantar foot and indicating Achilles tendon intact.  The skin flap was rotated into the appropriate position.  The deep subcutaneous tissue was then reapproximated well coapted with 3-0 Vicryl taking care to place minimal tension upon the stitches.  The skin was then reapproximated well coapted with 3-0 nylon in horizontal mattress type stitching taking care to apply very little tension overall to the  procedural area.  The laceration was completely able to be repaired.  Unfortunately patient still had somewhat sluggish capillary fill time after procedure.  10 cc of additional half percent Marcaine plain was injected about the operative area as well as tibial  nerve and sural nerve distributions.  A postoperative dressing was then applied consisting of Xeroform to the incisional area followed by 4 x 4 gauze, ABD, Kerlix, Webril, Ace wrap.  The patient tolerated the procedure and anesthesia well and was transferred to recovery room vital signs stable vascular status appearing to be intact grossly to the left forefoot but appeared to still be sluggish overall to the plantar heel flap.  Patient will have to be nonweightbearing at all times left lower extremity for a number of weeks.  If skin flap does become necrotic or die patient will need further subsequent debridement, likely wound VAC therapy and graft application.  Patient would have a very prolonged postoperative course if skin flap becomes necrotic.  Recommend keeping foot in a dependent position overnight and further evaluation tomorrow to assess skin flap integrity.  Patient be transferred back to the inpatient room after procedure for further monitoring.  Orders placed.  COMPLICATIONS: None  CONDITION: Good, stable  Caroline More, DPM

## 2022-03-17 NOTE — Transfer of Care (Signed)
Immediate Anesthesia Transfer of Care Note  Patient: Rodney Callahan  Procedure(s) Performed: IRRIGATION AND DEBRIDEMENT FOOT; CLOSURE OF SOFT TISSUE LACERATION (Left: Foot)  Patient Location: PACU  Anesthesia Type:General  Level of Consciousness: drowsy  Airway & Oxygen Therapy: Patient Spontanous Breathing  Post-op Assessment: Report given to RN and Post -op Vital signs reviewed and stable  Post vital signs: stable  Last Vitals:  Vitals Value Taken Time  BP 142/64   Temp 97.46F   Pulse 78 03/17/22 2224  Resp 16 03/17/22 2224  SpO2 95 % 03/17/22 2224  Vitals shown include unvalidated device data.  Last Pain:  Vitals:   03/17/22 1846  TempSrc:   PainSc: 3          Complications: No notable events documented.

## 2022-03-17 NOTE — Progress Notes (Signed)
Pharmacy Antibiotic Note  Rodney Callahan is a 26 y.o. male w/ PMH of Class III obesity admitted on 03/17/2022 with cellulitis.  Pharmacy has been consulted for cefazolin dosing.  Plan: start cefazolin 2 grams IV every 8 hours ---follow renal function for need dose adjustments  Height: 6\' 7"  (200.7 cm) Weight: (!) 163.3 kg (360 lb) IBW/kg (Calculated) : 93.7  Temp (24hrs), Avg:98.1 F (36.7 C), Min:98.1 F (36.7 C), Max:98.1 F (36.7 C)  Recent Labs  Lab 03/17/22 1616  WBC 7.8  CREATININE 1.05    Estimated Creatinine Clearance: 180 mL/min (by C-G formula based on SCr of 1.05 mg/dL).    Allergies  Allergen Reactions   Other Swelling, Rash and Anaphylaxis    Reactive agents: Jim's Barbeque Sauce & Lemon Pepper LEMON PEPPER   Acyclovir And Related    Peach Flavor Hives    Antimicrobials this admission: 10/31 cefazolin >>   Thank you for allowing pharmacy to be a part of this patient's care.  Dallie Piles 03/17/2022 8:17 PM

## 2022-03-17 NOTE — Assessment & Plan Note (Addendum)
Continue daily Ancef prophylaxis pending podiatry Pain control Patient received Tdap in the ED Podiatry consulted Discussed with Dr Luana Shu who will take patient to the OR tonight

## 2022-03-17 NOTE — Anesthesia Procedure Notes (Signed)
Procedure Name: Intubation Date/Time: 03/17/2022 9:02 PM  Performed by: Loree Fee, CRNAPre-anesthesia Checklist: Patient identified, Patient being monitored, Timeout performed, Emergency Drugs available and Suction available Patient Re-evaluated:Patient Re-evaluated prior to induction Oxygen Delivery Method: Circle system utilized Preoxygenation: Pre-oxygenation with 100% oxygen Induction Type: IV induction Ventilation: Mask ventilation without difficulty Laryngoscope Size: Mac and 4 Grade View: Grade I Tube type: Oral Tube size: 7.0 mm Number of attempts: 1 Airway Equipment and Method: Stylet Placement Confirmation: ETT inserted through vocal cords under direct vision, positive ETCO2, breath sounds checked- equal and bilateral and CO2 detector Secured at: 24 cm Tube secured with: Tape Dental Injury: Teeth and Oropharynx as per pre-operative assessment

## 2022-03-18 ENCOUNTER — Encounter: Payer: Self-pay | Admitting: Podiatry

## 2022-03-18 DIAGNOSIS — S91312A Laceration without foreign body, left foot, initial encounter: Secondary | ICD-10-CM | POA: Diagnosis not present

## 2022-03-18 LAB — CBC
HCT: 43.8 % (ref 39.0–52.0)
Hemoglobin: 14.1 g/dL (ref 13.0–17.0)
MCH: 24.6 pg — ABNORMAL LOW (ref 26.0–34.0)
MCHC: 32.2 g/dL (ref 30.0–36.0)
MCV: 76.4 fL — ABNORMAL LOW (ref 80.0–100.0)
Platelets: 294 10*3/uL (ref 150–400)
RBC: 5.73 MIL/uL (ref 4.22–5.81)
RDW: 15.5 % (ref 11.5–15.5)
WBC: 9.2 10*3/uL (ref 4.0–10.5)
nRBC: 0 % (ref 0.0–0.2)

## 2022-03-18 LAB — BASIC METABOLIC PANEL
Anion gap: 8 (ref 5–15)
BUN: 14 mg/dL (ref 6–20)
CO2: 26 mmol/L (ref 22–32)
Calcium: 9.4 mg/dL (ref 8.9–10.3)
Chloride: 106 mmol/L (ref 98–111)
Creatinine, Ser: 0.94 mg/dL (ref 0.61–1.24)
GFR, Estimated: >60 mL/min (ref >60)
Glucose, Bld: 139 mg/dL — ABNORMAL HIGH (ref 70–99)
Potassium: 4.3 mmol/L (ref 3.5–5.1)
Sodium: 140 mmol/L (ref 135–145)

## 2022-03-18 LAB — IRON AND TIBC
Iron: 51 ug/dL (ref 45–182)
Saturation Ratios: 14 % — ABNORMAL LOW (ref 17.9–39.5)
TIBC: 364 ug/dL (ref 250–450)
UIBC: 313 ug/dL

## 2022-03-18 LAB — FERRITIN: Ferritin: 32 ng/mL (ref 24–336)

## 2022-03-18 LAB — HIV ANTIBODY (ROUTINE TESTING W REFLEX): HIV Screen 4th Generation wRfx: NONREACTIVE

## 2022-03-18 MED ORDER — MENTHOL 3 MG MT LOZG
1.0000 | LOZENGE | OROMUCOSAL | Status: DC | PRN
  Administered 2022-03-18 (×2): 3 mg via ORAL
  Filled 2022-03-18: qty 9

## 2022-03-18 NOTE — Progress Notes (Signed)
PODIATRY / FOOT AND ANKLE SURGERY PROGRESS NOTE  Requesting Physician: Dr. Francine Graven  Reason for consult: Left foot laceration  Chief Complaint: Left foot pain/open wound after crush injury   HPI: Rodney Callahan is a 26 y.o. male who presents today resting in bed comfortably with leg held in a dependent position left side.  Patient states that the numbness is wearing off since the procedure and he is starting to feel little bit more pain down to the area.  He overall is doing pretty well at this time and presents today with mother at bedside.  PMHx:  Past Medical History:  Diagnosis Date   Amblyopia of left eye    Astigmatism    DVD (dissociated vertical deviation)    Right Eye   Exotropia    Foreign body of esophagus    Myopia    Nystagmus    Ptosis of left eyelid     Surgical Hx:  Past Surgical History:  Procedure Laterality Date   dysphagia     ESOPHAGOGASTRODUODENOSCOPY N/A 11/21/2015   Procedure: ESOPHAGOGASTRODUODENOSCOPY (EGD);  Surgeon: Manya Silvas, MD;  Location: [facility] ENDOSCOPY;  Service: Endoscopy;  Laterality: N/A;   ESOPHAGOGASTRODUODENOSCOPY (EGD) WITH PROPOFOL N/A 01/03/2015   Procedure: ESOPHAGOGASTRODUODENOSCOPY (EGD) WITH PROPOFOL;  Surgeon: Manya Silvas, MD;  Location: [facility] ENDOSCOPY;  Service: Endoscopy;  Laterality: N/A;   ESOPHAGOGASTRODUODENOSCOPY (EGD) WITH PROPOFOL N/A 04/29/2015   Procedure: ESOPHAGOGASTRODUODENOSCOPY (EGD) WITH PROPOFOL;  Surgeon: Manya Silvas, MD;  Location: [facility] ENDOSCOPY;  Service: Endoscopy;  Laterality: N/A;   ESOPHAGOGASTRODUODENOSCOPY (EGD) WITH PROPOFOL N/A 02/10/2016   Procedure: ESOPHAGOGASTRODUODENOSCOPY (EGD) WITH PROPOFOL;  Surgeon: Manya Silvas, MD;  Location: [facility] ENDOSCOPY;  Service: Endoscopy;  Laterality: N/A;   EYE SURGERY     eyelid surgery; frontalis suspension   EYE SURGERY     strabismus eye surgery; bilat; lateral rectus recession   EYE SURGERY     strabismus eye surgery; bilat; superior  recuts recessions - OU; medial rectus resection - OS   EYE SURGERY     strabismus eye surgery; Left; inferior oblique anteriorization w/dissection of scar tissue   FRONTALIS SUSPENSION     IRRIGATION AND DEBRIDEMENT FOOT Left 03/17/2022   Procedure: IRRIGATION AND DEBRIDEMENT FOOT; CLOSURE OF SOFT TISSUE LACERATION;  Surgeon: Caroline More, DPM;  Location: [facility] ORS;  Service: Podiatry;  Laterality: Left;   SAVORY DILATION N/A 04/29/2015   Procedure: SAVORY DILATION;  Surgeon: Manya Silvas, MD;  Location[facility] END[facility]COPY;  Service: Endoscopy;  Laterality: N/A;   STRABISMUS SURGERY     TONSILLECTOMY      FHx:  Family History  Problem Relation Age of Onset   ADD / ADHD Mother    Osteoarthritis Father     Social History:  reports that he has never smoked. He has never used smokeless tobacco. He reports current alcohol use. He reports that he does not use drugs.  Allergies:  Allergies  Allergen Reactions   Other Swelling, Rash and Anaphylaxis    Reactive agents: Jim's Barbeque Sauce & Lemon Pepper LEMON PEPPER   Acyclovir And Related    Peach Flavor Hives   Medications Prior to Admission  Medication Sig Dispense Refill   amoxicillin-clavulanate (AUGMENTIN) 600-42.9 MG/5ML suspension 10 ml bid x 10 days 200 mL 0   benzonatate (TESSALON) 100 MG capsule Take 2 capsules (200 mg total) by mouth every 8 (eight) hours. 21 capsule 0   ipratropium (ATROVENT) 0.06 % nasal spray Place 2 sprays into both nostrils 4 (  four) times daily. 15 mL 12   mupirocin ointment (BACTROBAN) 2 % Apply 1 application topically 2 (two) times daily. 22 g 0   promethazine-dextromethorphan (PROMETHAZINE-DM) 6.25-15 MG/5ML syrup Take 5 mLs by mouth 4 (four) times daily as needed. 118 mL 0     Physical Exam: General: Alert and oriented.  No apparent distress.  Vascular: DP/PT pulses palpable bilateral.  Capillary fill time appears to be intact to digits bilateral, but somewhat sluggish to plantar heel flap.   Hair growth present to digits bilateral lower extremities.  Neuro: Light touch sensation intact to digits bilaterally.  Derm: Laceration site appears to be well coapted with sutures intact, capillary fill time appears to be intact to skin flap overall, minimal tension apparent on skin flap.  Appears to be greatly improved overall compared to yesterday.  Mild edema, minimal to no erythema, no drainage.  Plantar portion of the flap has some hemorrhagic appearance as the skin was cut and an angled orientation with the injury.  Skin flap overall appears to be viable.    MSK: Pain on palpation to the left heel  Results for orders placed or performed during the hospital encounter of 03/17/22 (from the past 48 hour(s))  CBC     Status: Abnormal   Collection Time: 03/17/22  4:16 PM  Result Value Ref Range   WBC 7.8 4.0 - 10.5 K/uL   RBC 6.01 (H) 4.22 - 5.81 MIL/uL   Hemoglobin 14.7 13.0 - 17.0 g/dL   HCT 46.0 39.0 - 52.0 %   MCV 76.5 (L) 80.0 - 100.0 fL   MCH 24.5 (L) 26.0 - 34.0 pg   MCHC 32.0 30.0 - 36.0 g/dL   RDW 15.3 11.5 - 15.5 %   Platelets 303 150 - 400 K/uL   nRBC 0.0 0.0 - 0.2 %    Comment: Performed at [facility], [address], [place], [place] [id number]  Basic metabolic panel     Status: None   Collection Time: 03/17/22  4:16 PM  Result Value Ref Range   Sodium 140 135 - 145 mmol/L   Potassium 3.8 3.5 - 5.1 mmol/L   Chloride 106 98 - 111 mmol/L   CO2 28 22 - 32 mmol/L   Glucose, Bld 99 70 - 99 mg/dL    Comment: Glucose reference range applies only to samples taken after fasting for at least 8 hours.   BUN 13 6 - 20 mg/dL   Creatinine, Ser 1.05 0.61 - 1.24 mg/dL   Calcium 9.3 8.9 - 10.3 mg/dL   GFR, Estimated >60 >60 mL/min    Comment: (NOTE) Calculated using the CKD-EPI Creatinine Equation (2021)    Anion gap 6 5 - 15    Comment: Performed at [facility], [address] [place], [place] [address]  CBC     Status: Abnormal   Collection Time:  03/18/22  6:32 AM  Result Value Ref Range   WBC 9.2 4.0 - 10.5 K/uL   RBC 5.73 4.22 - 5.81 MIL/uL   Hemoglobin 14.1 13.0 - 17.0 g/dL   HCT 43.8 39.0 - 52.0 %   MCV 76.4 (L) 80.0 - 100.0 fL   MCH 24.6 (L) 26.0 - 34.0 pg   MCHC 32.2 30.0 - 36.0 g/dL   RDW 15.5 11.5 - 15.5 %   Platelets 294 150 - 400 K/uL   nRBC 0.0 0.0 - 0.2 %    Comment: Performed at [facility], [address] [place], [place]  [address]  Basic metabolic panel     Status: Abnormal   Collection Time: 03/18/22  6:32 AM  Result Value Ref Range   Sodium 140 135 - 145 mmol/L   Potassium 4.3 3.5 - 5.1 mmol/L   Chloride 106 98 - 111 mmol/L   CO2 26 22 - 32 mmol/L   Glucose, Bld 139 (H) 70 - 99 mg/dL    Comment: Glucose reference range applies only to samples taken after fasting for at least 8 hours.   BUN 14 6 - 20 mg/dL   Creatinine, Ser 0.94 0.61 - 1.24 mg/dL   Calcium 9.4 8.9 - 10.3 mg/dL   GFR, Estimated >60 >60 mL/min    Comment: (NOTE) Calculated using the CKD-EPI Creatinine Equation (2021)    Anion gap 8 5 - 15    Comment: Performed at [facility], [address] [place], [place] [address]   DG Foot Complete Left  Result Date: 03/17/2022 CLINICAL DATA:  Foot injury of work. EXAM: LEFT FOOT - COMPLETE 3+ VIEW COMPARISON:  None Available. FINDINGS: Soft tissue wound posterior to the calcaneus involving the heel. There is gas in the soft tissues with laceration and overlying bandage. No fracture identified. No osteomyelitis. Mild degenerative change first MTP. IMPRESSION: Soft tissue wound posterior to the calcaneus. No fracture or osteomyelitis. Electronically Signed   By: Franchot Gallo M.D.   On: 03/17/2022 16:52    Blood pressure 135/67, pulse 83, temperature 98.3 F (36.8 C), resp. rate 16, height 6\' 7"  (2.007 m), weight (!) 163.3 kg, SpO2 94 %.   Assessment Deep laceration present to the left posterior and plantar heel Left foot crush injury  Plan -Patient seen and  examined -Laceration site appears to be well coapted with sutures intact.  Do not see any evidence of necrosis.  Skin flaps appear to be healthy and viable. -Redressed today with Xeroform to the incision line followed by 4 x 4 gauze, ABD, Kerlix, Ace wrap.  Patient to keep dressings clean, dry, and intact and do not get the foot wet. -Patient is to be nonweightbearing at all times to the left lower extremity.  Discussed with patient he will likely have to be this way for potentially 4 to 6 weeks after the procedure. -Prevalon boot ordered.  Patient should be wearing this when resting in bed as direct pressure on the posterior/plantar aspect of the heel could result in necrosis of skin flap. -Appreciate medicine recommendations for pain management.  Recommend upon discharge 7-day course of Percocet 5/325 1 p.o. every 6 hours as needed pain, 28 tablets for moderate to severe pain.  Patient may also additionally take Tylenol as needed and ibuprofen for mild pain. -Recommend still keeping the left lower extremity in a somewhat dependent position to improve circulation to foot. -Appreciate PT recommendations.  Podiatry team to sign off at this time.  Patient to follow-up in outpatient clinic in 1 week for further assessment of wound.  Caroline More, DPM 03/18/2022, 1:14 PM

## 2022-03-18 NOTE — TOC Progression Note (Signed)
Transition of Care [facility]) - Progression Note    Patient Details  Name: Rodney Callahan MRN: [id number] Date of Birth: February 08, 1996  Transition of Care [facility]) CM/SW [place], RN Phone Number: 03/18/2022, 3:09 PM  Clinical Narrative:   Spoke with Vivi Barrack the employer, they requested that I get the Standard Bari walker for the patient  to be delivered to the bedside by [employer], I explained that he will be non weight bearing for 4-6 weeks, I reviewed that he will follow up with Orthopedics in a week, I explained he will have a boot She requested that he provide her with a copy of the DC paperwork    Expected Discharge Plan: Home/Self Care Barriers to Discharge: Continued Medical Work up  Expected Discharge Plan and Services Expected Discharge Plan: Home/Self Care   Discharge Planning Services: CM Consult                                           Social Determinants of Health (SDOH) Interventions    Readmission Risk Interventions     No data to display

## 2022-03-18 NOTE — Hospital Course (Addendum)
Rodney Callahan is a 26 y.o. male with medical history significant for esophageal stricture, s/p dilatation x3, remote history of wheezing requiring inhalers,Class III obesity who presents to the ED following an injury while at work when his foot got caught between a forklift.  Patient was wearing sneakers at the time.  He sustained a laceration to the posterior foot .  He was able to "hobble" but with increasing difficulty to bear weight.  He was previously in his usual state of health. 10/31: Hypertensive in ED, labs unremarkable, X-ray foot showed soft tissue wound posterior to the calcaneus without fracture or osteomyelitis. Dr. Luana Shu who recommended Ancef and admission to hospitalist service with plans to take to the OR tonight. Pt underwent  Left foot incision and drainage with debridement of nonviable necrotic tissue. Left foot laceration repair 11/01: per podiatry, laceration appears intact, no necrosis - ok for d/c on abx and pain control w/ NWB. Discharge held for now while [facility] works to obtain HH/DME appropriate/safe for patient size/height   Consultants:  Podiatry   Procedures: 03/17/2022: Left foot incision and drainage with debridement of nonviable necrotic tissue. Left foot laceration repair      ASSESSMENT & PLAN:   Principal Problem:   Laceration with crush injury of heel, left, initial encounter Active Problems:   Obesity, Class III, BMI 40-49.9 (morbid obesity) ([place])   History of esophageal stricture/dysphagia with 'ringed esophagus' on EGD 2017   History of asthma   Elevated blood-pressure reading without diagnosis of hypertension   Laceration with crush injury of heel, left, initial encounter Received daily Ancef prophylaxis pending podiatry procedure Patient received Tdap in the ED Podiatry consulted, pt is s/p repair 03/17/22 Xeroform to the incision line followed by 4 x 4 gauze, ABD, Kerlix, Ace wrap.  Patient to keep dressings clean, dry, and intact and  do not get the foot wet. Patient is to be nonweightbearing at all times to the left lower extremity likely 4-6 weeks Prevalon boot ordered.  Patient should be wearing this when resting in bed as direct pressure on the posterior/plantar aspect of the heel could result in necrosis of skin flap. Recommend upon discharge 7-day course of Percocet 5/325 1 p.o. every 6 hours as needed pain, 28 tablets for moderate to severe pain. Recommend still keeping the left lower extremity in a somewhat dependent position to improve circulation to foot Podiatry has s/o  Obesity, Class III, BMI 40-49.9 (morbid obesity) (HCC) Weight 163.3 kg with height 6 feet 7 inches May complicate recovery/healing Needing to hold discharge to obtain appropriate equipment   History of esophageal stricture/dysphagia with 'ringed esophagus' on EGD 2017 History of dysphagia with food boluses getting stuck S/p dilatation x3, last time in 2017  History of asthma Not currently on inhalers and no wheezing in several years Albuterol prn  Elevated blood-pressure reading without diagnosis of hypertension SBP in the 160s with no prior diagnosis of HTN Possibly related to pain.  continue to monitor    DVT prophylaxis: SCD Pertinent IV fluids/nutrition: no continuous IV fluids  Central lines / invasive devices: none  Code Status: FULL CODE Family Communication: family at bedside on rounds   Disposition: observation TOC needs: home health / DME Barriers to discharge / significant pending items: HH/DME appropriate/safe for patient size/height

## 2022-03-18 NOTE — TOC Progression Note (Addendum)
Transition of Care [facility]) - Progression Note    Patient Details  Name: Rodney Callahan MRN: [id number] Date of Birth: 1995/06/23  Transition of Care [facility]) CM/SW [place], RN Phone Number: 03/18/2022, 8:46 AM  Clinical Narrative:    Spoke with the patient and he provided permission to speak with his Employer Vivi Barrack at [phone] or [phone]  I reached out to Vivi Barrack to return her call at [phone],  She requested the time that the patient was made Observation status and the time that he was admitted, I explained at this time he is still observation status and provided the time that he was seen by the Hospitalitis.   The patient had surgery last evening with Podiatry for an injury he sustained at work when a forklift crushed his foot per podiatry  surgical notes  Patient will have to be nonweightbearing at all times left lower extremity for a number of weeks.  If skin flap does become necrotic or die patient will need further subsequent debridement, likely wound VAC therapy and graft application.  Patient would have a very prolonged postoperative course if skin flap becomes necrotic.  Recommend keeping foot in a dependent position overnight .  Podiatry to reassess the foot and integrity of the skin flap today for further treatment if needed  TOC to follow for needs and assist      Expected Discharge Plan and Services                                                 Social Determinants of Health (SDOH) Interventions    Readmission Risk Interventions     No data to display

## 2022-03-18 NOTE — Evaluation (Addendum)
Physical Therapy Evaluation Patient Details Name: Rodney Callahan MRN: [id number] DOB: [birth year] Today's Date: 03/18/2022  History of Present Illness  Pt 26 y.o. male s/p left foot irrigation and debridement ; closure of soft tissue laceration. PMH includes esophageal stricture, wheezing rquiring inhalers, and class III obesity  Clinical Impression  Pt found supine in bed upon PT entry. Pt educated about NWB precautions. Pt supervision with bed mobility. Sit<>Stand with min assistx2 with crutches, CGA with RW and mod VC for technique. Pt ambulated 247ft with RW, CGA, and some unsteadiness that was resolved with mod VC to slow down and push down through the RW. Pt attempted crutches, but more unsteadiness was demonstrated and pt unsafe with impulsiveness, unsteady with transfers. Pt ascended/descended one step with min assist for stabilization of RW and max VC for sequencing and pushing down on RW. Further stair training deferred to ensure safety (difficulty with RW mechanics). Pt would benefit from skilled physical therapy to address listed deficits (see below) to increase independence with ADLs, functionality, and safety. Pt needs stair education prior to D/C. Current recommendation is HHPT to continue education, improve function, and return PLOF as able.      Recommendations for follow up therapy are one component of a multi-disciplinary discharge planning process, led by the attending physician.  Recommendations may be updated based on patient status, additional functional criteria and insurance authorization.  Follow Up Recommendations Home health PT      Assistance Recommended at Discharge Intermittent Supervision/Assistance  Patient can return home with the following  A little help with walking and/or transfers;A little help with bathing/dressing/bathroom;Assistance with cooking/housework;Assist for transportation;Direct supervision/assist for medications management;Help with stairs  or ramp for entrance    Equipment Recommendations Rolling walker (2 wheels);BSC/3in1 (bariatric RW and bariatric 3in1)  Recommendations for Other Services       Functional Status Assessment Patient has had a recent decline in their functional status and demonstrates the ability to make significant improvements in function in a reasonable and predictable amount of time.     Precautions / Restrictions Precautions Precautions: Fall;Other (comment) Precaution Comments: NWB in Left LE Restrictions Weight Bearing Restrictions: Yes LLE Weight Bearing: Non weight bearing      Mobility  Bed Mobility Overal bed mobility: Needs Assistance Bed Mobility: Sidelying to Sit   Sidelying to sit: Supervision            Transfers Overall transfer level: Needs assistance Equipment used: Rolling walker (2 wheels), Crutches (crutches attempted- unsteady) Transfers: Sit to/from Stand Sit to Stand: Min guard, +2 safety/equipment (sit to stand min x2 with crutches due to unsteadiness)                Ambulation/Gait   Gait Distance (Feet): 200 Feet Assistive device: Rolling walker (2 wheels), Crutches (36ft with crutches- unsteady)         General Gait Details: requires mod VC for safety cues with RW  Stairs Stairs: Yes Stairs assistance: Min guard Stair Management: No rails Number of Stairs: 1 General stair comments: pt lifted, cleared step, and placed supporting LE onto next step. Stair training ceased due to RW bending with pt weight.  Wheelchair Mobility    Modified Rankin (Stroke Patients Only)       Balance Overall balance assessment: Needs assistance Sitting-balance support: Bilateral upper extremity supported Sitting balance-Leahy Scale: Good     Standing balance support: Bilateral upper extremity supported Standing balance-Leahy Scale: Fair Standing balance comment: pt unsteady with crutches and sit<>stand transfer  Pertinent Vitals/Pain Pain Assessment Pain Assessment: No/denies pain    Home Living Family/patient expects to be discharged to:: Private residence Living Arrangements: Parent Available Help at Discharge: Family Type of Home: House Home Access: Stairs to enter Entrance Stairs-Rails: None Technical brewer of Steps: 4   Home Layout: Two level Home Equipment: None      Prior Function Prior Level of Function : Independent/Modified Independent                     Hand Dominance        Extremity/Trunk Assessment   Upper Extremity Assessment Upper Extremity Assessment: Overall WFL for tasks assessed    Lower Extremity Assessment Lower Extremity Assessment: Generalized weakness    Cervical / Trunk Assessment Cervical / Trunk Assessment: Normal  Communication   Communication: No difficulties  Cognition Arousal/Alertness: Awake/alert Behavior During Therapy: Impulsive Overall Cognitive Status: Within Functional Limits for tasks assessed                                          General Comments      Exercises     Assessment/Plan    PT Assessment Patient needs continued PT services  PT Problem List Decreased strength;Decreased safety awareness;Decreased mobility;Decreased knowledge of precautions;Decreased activity tolerance;Decreased skin integrity;Decreased balance;Decreased knowledge of use of DME;Impaired sensation       PT Treatment Interventions DME instruction;Therapeutic exercise;Gait training;Balance training;Stair training;Neuromuscular re-education;Functional mobility training;Therapeutic activities;Patient/family education    PT Goals (Current goals can be found in the Care Plan section)  Acute Rehab PT Goals Patient Stated Goal: to return home PT Goal Formulation: With patient/family Time For Goal Achievement: 04/01/22 Potential to Achieve Goals: Good    Frequency 7X/week (Simultaneous filing. User may not have  seen previous data.)     Co-evaluation               AM-PAC PT "6 Clicks" Mobility  Outcome Measure Help needed turning from your back to your side while in a flat bed without using bedrails?: None Help needed moving from lying on your back to sitting on the side of a flat bed without using bedrails?: None Help needed moving to and from a bed to a chair (including a wheelchair)?: A Little Help needed standing up from a chair using your arms (e.g., wheelchair or bedside chair)?: A Little Help needed to walk in hospital room?: A Little Help needed climbing 3-5 steps with a railing? : A Little 6 Click Score: 20    End of Session Equipment Utilized During Treatment: Gait belt Activity Tolerance: Patient tolerated treatment well Patient left: in bed;with family/visitor present;with call bell/phone within reach Nurse Communication: Mobility status PT Visit Diagnosis: Unsteadiness on feet (R26.81);Muscle weakness (generalized) (M62.81);Difficulty in walking, not elsewhere classified (R26.2)    Time: 1015-1100 PT Time Calculation (min) (ACUTE ONLY): 45 min   Charges:   PT Evaluation $PT Eval Low Complexity: 1 Low PT Treatments $Gait Training: 38-52 mins          [employer], SPT 03/18/2022, 2:02 PM

## 2022-03-18 NOTE — Anesthesia Postprocedure Evaluation (Signed)
Anesthesia Post Note  Patient: Rodney Callahan  Procedure(s) Performed: IRRIGATION AND DEBRIDEMENT FOOT; CLOSURE OF SOFT TISSUE LACERATION (Left: Foot)  Patient location during evaluation: PACU Anesthesia Type: General Level of consciousness: awake and alert Pain management: pain level controlled Vital Signs Assessment: post-procedure vital signs reviewed and stable Respiratory status: spontaneous breathing, nonlabored ventilation and respiratory function stable Cardiovascular status: blood pressure returned to baseline and stable Postop Assessment: no apparent nausea or vomiting Anesthetic complications: no   No notable events documented.   Last Vitals:  Vitals:   03/17/22 2321 03/18/22 0400  BP: 134/62 124/75  Pulse: 65 81  Resp: 16 18  Temp: 36.7 C 36.6 C  SpO2: 99% 95%    Last Pain:  Vitals:   03/18/22 0400  TempSrc: Oral  PainSc:                  [name]

## 2022-03-18 NOTE — Plan of Care (Signed)

## 2022-03-18 NOTE — Progress Notes (Signed)
PROGRESS NOTE    Rodney Callahan   OMV:[id number] DOB: [birth year]  DOA: 03/17/2022 Date of Service: 03/18/22 PCP: Manya Silvas, MD (Inactive)     Brief Narrative / Hospital Course:  Rodney Callahan is a 26 y.o. male with medical history significant for esophageal stricture, s/p dilatation x3, remote history of wheezing requiring inhalers,Class III obesity who presents to the ED following an injury while at work when his foot got caught between a forklift.  Patient was wearing sneakers at the time.  He sustained a laceration to the posterior foot .  He was able to "hobble" but with increasing difficulty to bear weight.  He was previously in his usual state of health. 10/31: Hypertensive in ED, labs unremarkable, X-ray foot showed soft tissue wound posterior to the calcaneus without fracture or osteomyelitis. Dr. Luana Shu who recommended Ancef and admission to hospitalist service with plans to take to the OR tonight. Pt underwent  Left foot incision and drainage with debridement of nonviable necrotic tissue. Left foot laceration repair 11/01: per podiatry, laceration appears intact, no necrosis - ok for d/c on abx and pain control w/ NWB. Discharge held for now while [facility] works to obtain HH/DME appropriate/safe for patient size/height   Consultants:  Podiatry   Procedures: 03/17/2022: Left foot incision and drainage with debridement of nonviable necrotic tissue. Left foot laceration repair      ASSESSMENT & PLAN:   Principal Problem:   Laceration with crush injury of heel, left, initial encounter Active Problems:   Obesity, Class III, BMI 40-49.9 (morbid obesity) ([place])   History of esophageal stricture/dysphagia with 'ringed esophagus' on EGD 2017   History of asthma   Elevated blood-pressure reading without diagnosis of hypertension   Laceration with crush injury of heel, left, initial encounter Received daily Ancef prophylaxis pending podiatry  procedure Patient received Tdap in the ED Podiatry consulted, pt is s/p repair 03/17/22 Xeroform to the incision line followed by 4 x 4 gauze, ABD, Kerlix, Ace wrap.  Patient to keep dressings clean, dry, and intact and do not get the foot wet. Patient is to be nonweightbearing at all times to the left lower extremity likely 4-6 weeks Prevalon boot ordered.  Patient should be wearing this when resting in bed as direct pressure on the posterior/plantar aspect of the heel could result in necrosis of skin flap. Recommend upon discharge 7-day course of Percocet 5/325 1 p.o. every 6 hours as needed pain, 28 tablets for moderate to severe pain. Recommend still keeping the left lower extremity in a somewhat dependent position to improve circulation to foot Podiatry has s/o  Obesity, Class III, BMI 40-49.9 (morbid obesity) (HCC) Weight 163.3 kg with height 6 feet 7 inches May complicate recovery/healing Needing to hold discharge to obtain appropriate equipment   History of esophageal stricture/dysphagia with 'ringed esophagus' on EGD 2017 History of dysphagia with food boluses getting stuck S/p dilatation x3, last time in 2017  History of asthma Not currently on inhalers and no wheezing in several years Albuterol prn  Elevated blood-pressure reading without diagnosis of hypertension SBP in the 160s with no prior diagnosis of HTN Possibly related to pain.  continue to monitor    DVT prophylaxis: SCD Pertinent IV fluids/nutrition: no continuous IV fluids  Central lines / invasive devices: none  Code Status: FULL CODE Family Communication: family at bedside on rounds   Disposition: observation TOC needs: home health / DME Barriers to discharge / significant pending items: HH/DME appropriate/safe for patient size/height  Subjective:  Patient reports leg feels buning as anesthesia wearing off        Objective:  Vitals:   03/17/22 2255 03/17/22 2321  03/18/22 0400 03/18/22 0822  BP: 131/86 134/62 124/75 135/67  Pulse: 77 65 81 83  Resp: 13 16 18 16   Temp:  98 F (36.7 C) 97.9 F (36.6 C) 98.3 F (36.8 C)  TempSrc:  Oral Oral   SpO2: 99% 99% 95% 94%  Weight:      Height:        Intake/Output Summary (Last 24 hours) at 03/18/2022 1457 Last data filed at 03/18/2022 1432 Gross per 24 hour  Intake 970 ml  Output 3 ml  Net 967 ml   Filed Weights   03/17/22 1612  Weight: (!) 163.3 kg    Examination:  Constitutional:  VS as above General Appearance: alert, well-developed, well-nourished, NAD Respiratory: Normal respiratory effort No wheeze No rhonchi No rales Cardiovascular: S1/S2 normal No murmur No lower extremity edema Gastrointestinal: No tenderness Musculoskeletal:  No clubbing/cyanosis of digits Symmetrical movement in all extremities Bandaged L foot Neurological: No cranial nerve deficit on limited exam Alert Psychiatric: Normal judgment/insight Normal mood and affect       Scheduled Medications:    Continuous Infusions:   ceFAZolin (ANCEF) IV 2 g (03/18/22 1339)    PRN Medications:  acetaminophen **OR** acetaminophen, albuterol, HYDROcodone-acetaminophen, ketorolac, menthol-cetylpyridinium, ondansetron **OR** ondansetron (ZOFRAN) IV  Antimicrobials:  Anti-infectives (From admission, onward)    Start     Dose/Rate Route Frequency Ordered Stop   03/18/22 0600  ceFAZolin (ANCEF) IVPB 2g/100 mL premix        2 g 200 mL/hr over 30 Minutes Intravenous Every 8 hours 03/17/22 2020     03/17/22 1800  ceFAZolin (ANCEF) IVPB 2g/100 mL premix        2 g 200 mL/hr over 30 Minutes Intravenous  Once 03/17/22 1759 03/17/22 1924       Data Reviewed: I have personally reviewed following labs and imaging studies  CBC: Recent Labs  Lab 03/17/22 1616 03/18/22 0632  WBC 7.8 9.2  HGB 14.7 14.1  HCT 46.0 43.8  MCV 76.5* 76.4*  PLT 303 294   Basic Metabolic Panel: Recent Labs  Lab  03/17/22 1616 03/18/22 0632  NA 140 140  K 3.8 4.3  CL 106 106  CO2 28 26  GLUCOSE 99 139*  BUN 13 14  CREATININE 1.05 0.94  CALCIUM 9.3 9.4   GFR: Estimated Creatinine Clearance: 201.1 mL/min (by C-G formula based on SCr of 0.94 mg/dL). Liver Function Tests: No results for input(s): "AST", "ALT", "ALKPHOS", "BILITOT", "PROT", "ALBUMIN" in the last 168 hours. No results for input(s): "LIPASE", "AMYLASE" in the last 168 hours. No results for input(s): "AMMONIA" in the last 168 hours. Coagulation Profile: No results for input(s): "INR", "PROTIME" in the last 168 hours. Cardiac Enzymes: No results for input(s): "CKTOTAL", "CKMB", "CKMBINDEX", "TROPONINI" in the last 168 hours. BNP (last 3 results) No results for input(s): "PROBNP" in the last 8760 hours. HbA1C: No results for input(s): "HGBA1C" in the last 72 hours. CBG: No results for input(s): "GLUCAP" in the last 168 hours. Lipid Profile: No results for input(s): "CHOL", "HDL", "LDLCALC", "TRIG", "CHOLHDL", "LDLDIRECT" in the last 72 hours. Thyroid Function Tests: No results for input(s): "TSH", "T4TOTAL", "FREET4", "T3FREE", "THYROIDAB" in the last 72 hours. Anemia Panel: No results for input(s): "VITAMINB12", "FOLATE", "FERRITIN", "TIBC", "IRON", "RETICCTPCT" in the last 72 hours. Urine analysis: No results found for: "COLORURINE", "APPEARANCEUR", "  LABSPEC", "PHURINE", "GLUCOSEU", "HGBUR", "BILIRUBINUR", "KETONESUR", "PROTEINUR", "UROBILINOGEN", "NITRITE", "LEUKOCYTESUR" Sepsis Labs: @LABRCNTIP (procalcitonin:4,lacticidven:4)  No results found for this or any previous visit (from the past 240 hour(s)).       Radiology Studies: DG Foot Complete Left  Result Date: 03/17/2022 CLINICAL DATA:  Foot injury of work. EXAM: LEFT FOOT - COMPLETE 3+ VIEW COMPARISON:  None Available. FINDINGS: Soft tissue wound posterior to the calcaneus involving the heel. There is gas in the soft tissues with laceration and overlying bandage.  No fracture identified. No osteomyelitis. Mild degenerative change first MTP. IMPRESSION: Soft tissue wound posterior to the calcaneus. No fracture or osteomyelitis. Electronically Signed   By: 03/19/2022 M.D.   On: 03/17/2022 16:52            LOS: 0 days      03/19/2022, DO [facility] 03/18/2022, 2:57 PM   Staff may message me via secure chat in Epic  but this may not receive immediate response,  please page for urgent matters!  If 7PM-7AM, please contact night-coverage [email or web address]  Dictation software was used to generate the above note. Typos may occur and escape review, as with typed/written notes. Please contact Dr 13/05/2021 directly for clarity if needed.

## 2022-03-18 NOTE — Progress Notes (Addendum)
Physical Therapy Treatment Patient Details Name: Rodney Callahan MRN: [id number] DOB: 09/18/1995 Today's Date: 03/18/2022   History of Present Illness Rodney Callahan comes to [facility] on 03/17/22 after foot injury sustained via forklift, injury to posterior left heel. No acute fracture or dislocation seen on scans. Podiatry consult placed, pt taken to OR on 03/17/22 of rexploratory I&D due to deep laceration s/p crush injury. Per podistry notes, achilles complex appearing intact. Postoperatively pt is NWB. Podiatry also notes sluggish capilary refill, need to monitor healing of skin island, recommends dependent positioning of foot to maximize perfusion, and use of prevalon boot when resting to offload surture area.    PT Comments    Pt in bed on entry, mom and 2nd visitor present eating [employer]. Pt calm, pleasant, remains motivated to go home when able, but concerned about stairs navigation. Pt agreeable to session. Author explains rationale and risks associated with use of bariatric standard walker v earlier use of bariatric FWW. Pt shows ability to AMB in room, out into hall, perform transfers with bari SW, no LOB, demonstrates prudence and caution, good exertion tolerance, excellent adherence to weight bearing. Pt then given visual demonstration of SW adjustment (5 spring pin differential on rear) for stairs performance, goal of achieving near-level walker positioning while on stairs grading. Explained best practice recommendation of using adjustment for stairs only and taking a seated interval before/after stairs performance for walker adjustment. Author demos stairs technique, pt concurs. Pt shows correct stairs technique x7, author concurs. Pt asks about stairwell demonstration to facility possible DC same day. Author agreeable. Pt taken to 1a stairwell in recliner with 2 guests (mom included) and bari SW. Education demo provided, then pt performs 6 stairs up and down. Pt educated on bed  functions to achieve dependent positioning of limb without throwing his leg out of the bed. RN contacted regarding Prevalon boot order. Mother asks about 2nd boot recommendation described by surgeon- no orders seen. Coordinated with TOC at [facility].     Recommendations for follow up therapy are one component of a multi-disciplinary discharge planning process, led by the attending physician.  Recommendations may be updated based on patient status, additional functional criteria and insurance authorization.  Follow Up Recommendations  Home health PT     Assistance Recommended at Discharge Set up Supervision/Assistance  Patient can return home with the following A little help with walking and/or transfers;A little help with bathing/dressing/bathroom;Assistance with cooking/housework;Assist for transportation;Help with stairs or ramp for entrance   Equipment Recommendations  Other (comment) (standard bariatric walker; bariatric BSC)    Recommendations for Other Services       Precautions / Restrictions Precautions Precautions: Fall;Other (comment) Precaution Comments: NWB in Left LE Required Braces or Orthoses: Other Brace Other Brace: Prevalon Boot when in bed resting Restrictions Weight Bearing Restrictions: Yes LLE Weight Bearing: Non weight bearing     Mobility  Bed Mobility Overal bed mobility: Modified Independent Bed Mobility: Supine to Sit, Sit to Supine     Supine to sit: Modified independent (Device/Increase time) Sit to supine: Modified independent (Device/Increase time)        Transfers Overall transfer level: Needs assistance Equipment used: Standard walker (bari SW) Transfers: Sit to/from Stand Sit to Stand: From elevated surface, Min guard           General transfer comment: multiple times in session, multiple setups; did require heavy minA on one of these setups.    Ambulation/Gait   Gait Distance (Feet): 50 Feet Assistive device:  Standard walker (bari  SW) Gait Pattern/deviations: Step-to pattern       General Gait Details: minimal cues needed, explained increased risk of LOB with SW v RW, pt acknowledges and adjusts as expected   Stairs Stairs: Yes Stairs assistance: Min guard Stair Management: No rails, Backwards Number of Stairs: 13 General stair comments: Using bariSW (600lb), back poles placed 5 pin holes difference to create near-level positioning while on steps. 7 steps performed up/down in room on travel step, then 6 steps up/down in stairwell. Excellent eccentric control on SW   Wheelchair Mobility    Modified Rankin (Stroke Patients Only)       Balance Overall balance assessment: Modified Independent   Sitting balance-Leahy Scale: Normal     Standing balance support: Single extremity supported, During functional activity, Reliant on assistive device for balance Standing balance-Leahy Scale: Normal               High level balance activites: Turns, Direction changes, Backward walking High Level Balance Comments: NWB walking/stairs training            Cognition Arousal/Alertness: Awake/alert Behavior During Therapy: WFL for tasks assessed/performed Overall Cognitive Status: Within Functional Limits for tasks assessed                                          Exercises      General Comments        Pertinent Vitals/Pain Pain Assessment Pain Assessment: No/denies pain    Home Living Family/patient expects to be discharged to:: Private residence Living Arrangements: Parent Available Help at Discharge: Family Type of Home: House Home Access: Stairs to enter Entrance Stairs-Rails: None Technical brewer of Steps: 4   Home Layout: Two level Home Equipment: None      Prior Function            PT Goals (current goals can now be found in the care plan section) Acute Rehab PT Goals Patient Stated Goal: to return home PT Goal Formulation: With patient/family Time  For Goal Achievement: 04/01/22 Potential to Achieve Goals: Good Progress towards PT goals: Progressing toward goals    Frequency    7X/week      PT Plan Current plan remains appropriate;Equipment recommendations need to be updated    Co-evaluation              AM-PAC PT "6 Clicks" Mobility   Outcome Measure  Help needed turning from your back to your side while in a flat bed without using bedrails?: None Help needed moving from lying on your back to sitting on the side of a flat bed without using bedrails?: None Help needed moving to and from a bed to a chair (including a wheelchair)?: A Little Help needed standing up from a chair using your arms (e.g., wheelchair or bedside chair)?: A Little Help needed to walk in hospital room?: A Little Help needed climbing 3-5 steps with a railing? : A Little 6 Click Score: 20    End of Session Equipment Utilized During Treatment: Gait belt Activity Tolerance: Patient tolerated treatment well;No increased pain;Patient limited by fatigue Patient left: in bed;with family/visitor present;with call bell/phone within reach Nurse Communication: Mobility status PT Visit Diagnosis: Unsteadiness on feet (R26.81);Muscle weakness (generalized) (M62.81);Difficulty in walking, not elsewhere classified (R26.2)     Time: 5093-2671 PT Time Calculation (min) (ACUTE ONLY): 41 min  Charges:  $Gait  Training: 38-52 mins                    3:41 PM, 03/18/22 Rosamaria Lints, PT, DPT Physical Therapist - [facility]  [phone] (ASCOM)    Rodney Callahan C 03/18/2022, 3:29 PM

## 2022-03-18 NOTE — Plan of Care (Signed)

## 2022-03-19 ENCOUNTER — Encounter: Payer: Self-pay | Admitting: Podiatry

## 2022-03-19 DIAGNOSIS — S91312A Laceration without foreign body, left foot, initial encounter: Secondary | ICD-10-CM | POA: Diagnosis not present

## 2022-03-19 MED ORDER — HYDROCODONE-ACETAMINOPHEN 5-325 MG PO TABS: 1.0000 | ORAL_TABLET | Freq: Four times a day (QID) | ORAL | 0 refills | Status: DC | PRN

## 2022-03-19 MED ORDER — IBUPROFEN 800 MG PO TABS: 800.0000 mg | ORAL_TABLET | Freq: Three times a day (TID) | ORAL | 0 refills | Status: DC | PRN

## 2022-03-19 NOTE — TOC Progression Note (Signed)
Transition of Care [facility]) - Progression Note    Patient Details  Name: Rodney Callahan MRN: [id number] Date of Birth: 04-12-1996  [facility]) CM/SW Contact  Conception Oms, RN Phone Number: 03/19/2022, 12:17 PM  Clinical Narrative:    [name] with [employer], she provided me with the Ins adjuster with workans Otilio Connors [phone] ext [phone] claim number [id number] I called Kevan Ny concerning a knee scooter, left a voice mail asking for a return call  Provided my contact inforamtion   Expected Discharge Plan: Home/Self Care Barriers to Discharge: Continued Medical Work up  Expected Discharge Plan and Services Expected Discharge Plan: Home/Self Care   Discharge Planning Services: CM Consult                                           Social Determinants of Health (SDOH) Interventions    Readmission Risk Interventions     No data to display

## 2022-03-19 NOTE — TOC Progression Note (Signed)
Transition of Care [facility]) - Progression Note    Patient Details  Name: Rodney Callahan MRN: [id number] Date of Birth: 1995/08/30  Transition of Care [facility]) CM/SW [place], RN Phone Number: 03/19/2022, 9:52 AM  Clinical Narrative:    Spoke with the Employer Vivi Barrack  at [phone] she approved for me to get him a 3 in 1,  but they will need to get approval for a hospital bed and a lift chair, she is saying not likely for the chair but Workmans comp will have to arrange a bed I reached out to [employer] to ask for a Bari 3 in 1   Expected Discharge Plan: Home/Self Care Barriers to Discharge: Continued Medical Work up  Expected Discharge Plan and Services Expected Discharge Plan: Home/Self Care   Discharge Planning Services: CM Consult                                           Social Determinants of Health (SDOH) Interventions    Readmission Risk Interventions     No data to display

## 2022-03-19 NOTE — Discharge Summary (Signed)
Physician Discharge Summary   Patient: Rodney Callahan MRN: [id number]  DOB: 1995/06/06   Admit:     Date of Admission: 03/17/2022 Admitted from: home   Discharge: Date of discharge: 03/19/22 Disposition: Home health Condition at discharge: good  CODE STATUS: [place]     Discharge Physician: Emeterio Reeve, DO [facility]     PCP: Manya Silvas, MD (Inactive)  Recommendations for Outpatient Follow-up:  Follow up with PCP Manya Silvas, MD (Inactive) in 2-4 weeks Podiatry followup in 1 week  Please follow up on the following pending results: none PCP AND OTHER OUTPATIENT PROVIDERS: SEE [place] ADDITION TO GENERIC AVS PATIENT INFO    Discharge Instructions     Diet - low sodium heart healthy   Complete by: As directed    Discharge wound care:   Complete by: As directed    Keep wound clean/dry, change bandages daily or as needed if soiled, any concerns please call podiatry or seek emergency medical care   Increase activity slowly   Complete by: As directed          Discharge Diagnoses: Principal Problem:   Laceration with crush injury of heel, left, initial encounter Active Problems:   Obesity, Class III, BMI 40-49.9 (morbid obesity) ([place])   History of esophageal stricture/dysphagia with 'ringed esophagus' on EGD 2017   History of asthma   Elevated blood-pressure reading without diagnosis of hypertension       Hospital Course: Rodney Callahan is a 26 y.o. male with medical history significant for esophageal stricture, s/p dilatation x3, remote history of wheezing requiring inhalers,Class III obesity who presents to the ED following an injury while at work when his foot got caught between a forklift.  Patient was wearing sneakers at the time.  He sustained a laceration to the posterior foot .  He was able to "hobble" but with increasing difficulty to bear  weight.  He was previously in his usual state of health. 10/31: Hypertensive in ED, labs unremarkable, X-ray foot showed soft tissue wound posterior to the calcaneus without fracture or osteomyelitis. Dr. Luana Shu who recommended Ancef and admission to hospitalist service with plans to take to the OR tonight. Pt underwent  Left foot incision and drainage with debridement of nonviable necrotic tissue. Left foot laceration repair 11/01: per podiatry, laceration appears intact, no necrosis - ok for d/c on abx and pain control w/ NWB. Discharge held for now while TOC works to obtain HH/DME appropriate/safe for patient size/height  11/02: as much as can be arranged on our end has been done re: DME> [employer] to handle the rest, pt medically clear for discharge home, he has no concerns   Consultants:  Podiatry   Procedures: 03/17/2022: Left foot incision and drainage with debridement of nonviable necrotic tissue. Left foot laceration repair      ASSESSMENT & PLAN:   Principal Problem:   Laceration with crush injury of heel, left, initial encounter Active Problems:   Obesity, Class III, BMI 40-49.9 (morbid obesity) (Rodney Callahan)   History of esophageal stricture/dysphagia with 'ringed esophagus' on EGD 2017   History of asthma   Elevated blood-pressure reading without diagnosis of hypertension   Laceration with crush injury of heel, left, initial encounter Received daily Ancef prophylaxis pending podiatry procedure Patient received Tdap in the ED Podiatry consulted, pt is s/p repair 03/17/22 Xeroform to the incision line followed by 4 x 4 gauze, ABD, Kerlix,  Ace wrap.  Patient to keep dressings clean, dry, and intact and do not get the foot wet. Patient is to be nonweightbearing at all times to the left lower extremity likely 4-6 weeks Prevalon boot ordered.  Patient should be wearing this when resting in bed as direct pressure on the posterior/plantar aspect of the heel could result in necrosis of  skin flap. Recommend still keeping the left lower extremity in a somewhat dependent position to improve circulation to foot Podiatry has s/o  Obesity, Class III, BMI 40-49.9 (morbid obesity) (H[facility]) Weight 163.3 kg with height 6 feet 7 inches May complicate recovery/healing Needed to hold discharge to obtain appropriate equipment   History of esophageal stricture/dysphagia with 'ringed esophagus' on EGD 2017 History of dysphagia with food boluses getting stuck S/p dilatation x3, last time in 2017  History of asthma Not currently on inhalers and no wheezing in several years Albuterol prn  Elevated blood-pressure reading without diagnosis of hypertension SBP in the 160s with no prior diagnosis of HTN Possibly related to pain.  continue to monitor            Discharge Instructions  Allergies as of 03/19/2022       Reactions   Other Swelling, Rash, Anaphylaxis   Reactive agents: Jim's Barbeque Sauce & Lemon Pepper LEMON PEPPER   Acyclovir And Related    Peach Flavor Hives        Medication List     STOP taking these medications    amoxicillin-clavulanate 600-42.9 MG/5ML suspension Commonly known as: AUGMENTIN   benzonatate 100 MG capsule Commonly known as: TESSALON   ipratropium 0.06 % nasal spray Commonly known as: ATROVENT   mupirocin ointment 2 % Commonly known as: BACTROBAN   promethazine-dextromethorphan 6.25-15 MG/5ML syrup Commonly known as: PROMETHAZINE-DM       TAKE these medications    HYDROcodone-acetaminophen 5-325 MG tablet Commonly known as: NORCO/VICODIN Take 1-2 tablets by mouth every 6 (six) hours as needed for moderate pain or severe pain.   ibuprofen 800 MG tablet Commonly known as: ADVIL Take 1 tablet (800 mg total) by mouth every 8 (eight) hours as needed for mild pain or moderate pain.               Durable Medical Equipment  (From admission, onward)           Start     Ordered   03/19/22 1626  For home use  only DME Hospital bed  Once       Question Answer Comment  Length of Need 6 Months   Patient has (list medical condition): non weight bearing on left foot   Bed type Semi-electric   Support Surface: Gel Overlay      03/19/22 1627   03/19/22 1232  For home use only DME Other see comment  Once       Comments: Knee scooter for patient over 6 feet 7 and 360 lbs  Question:  Length of Need  Answer:  6 Months   03/19/22 1232   03/19/22 0956  For home use only DME Bedside commode  Once       Comments: Bariatric  Question:  Patient needs a bedside commode to treat with the following condition  Answer:  Impaired mobility   03/19/22 0956              Discharge Care Instructions  (From admission, onward)           Start     Ordered  03/19/22 0000  Discharge wound care:       Comments: Keep wound clean/dry, change bandages daily or as needed if soiled, any concerns please call podiatry or seek emergency medical care   03/19/22 1656             Follow-up Information     Caroline More, DPM Follow up.   Specialty: Podiatry Why: confirm appointment in 1 week Contact information: [place] [address] [phone]                 Allergies  Allergen Reactions   Other Swelling, Rash and Anaphylaxis    Reactive agents: Jim's Barbeque Sauce & Lemon Pepper LEMON PEPPER   Acyclovir And Related    Peach Flavor Hives     Subjective: pt reports pain is controlled, no concerns    Discharge Exam: BP (!) 124/58 (BP Location: Left Arm)   Pulse 66   Temp 98.4 F (36.9 C)   Resp 18   Ht 6\' 7"  (2.007 m)   Wt (!) 163.3 kg   SpO2 97%   BMI 40.56 kg/m  General: Pt is alert, awake, not in acute distress Cardiovascular: RRR, S1/S2 +, no rubs, no gallops Respiratory: CTA bilaterally, no wheezing, no rhonchi Abdominal: Soft, NT, ND, bowel sounds + Extremities: no edema, no cyanosis     The results of significant diagnostics from this  hospitalization (including imaging, microbiology, ancillary and laboratory) are listed below for reference.     Microbiology: No results found for this or any previous visit (from the past 240 hour(s)).   Labs: BNP (last 3 results) No results for input(s): "BNP" in the last 8760 hours. Basic Metabolic Panel: Recent Labs  Lab 03/17/22 1616 03/18/22 0632  NA 140 140  K 3.8 4.3  CL 106 106  CO2 28 26  GLUCOSE 99 139*  BUN 13 14  CREATININE 1.05 0.94  CALCIUM 9.3 9.4   Liver Function Tests: No results for input(s): "AST", "ALT", "ALKPHOS", "BILITOT", "PROT", "ALBUMIN" in the last 168 hours. No results for input(s): "LIPASE", "AMYLASE" in the last 168 hours. No results for input(s): "AMMONIA" in the last 168 hours. CBC: Recent Labs  Lab 03/17/22 1616 03/18/22 0632  WBC 7.8 9.2  HGB 14.7 14.1  HCT 46.0 43.8  MCV 76.5* 76.4*  PLT 303 294   Cardiac Enzymes: No results for input(s): "CKTOTAL", "CKMB", "CKMBINDEX", "TROPONINI" in the last 168 hours. BNP: Invalid input(s): "POCBNP" CBG: No results for input(s): "GLUCAP" in the last 168 hours. D-Dimer No results for input(s): "DDIMER" in the last 72 hours. Hgb A1c No results for input(s): "HGBA1C" in the last 72 hours. Lipid Profile No results for input(s): "CHOL", "HDL", "LDLCALC", "TRIG", "CHOLHDL", "LDLDIRECT" in the last 72 hours. Thyroid function studies No results for input(s): "TSH", "T4TOTAL", "T3FREE", "THYROIDAB" in the last 72 hours.  Invalid input(s): "FREET3" Anemia work up Recent Labs    03/18/22 0619  FERRITIN 32  TIBC 364  IRON 51   Urinalysis No results found for: "COLORURINE", "APPEARANCEUR", "LABSPEC", "PHURINE", "GLUCOSEU", "HGBUR", "BILIRUBINUR", "KETONESUR", "PROTEINUR", "UROBILINOGEN", "NITRITE", "LEUKOCYTESUR" Sepsis Labs Recent Labs  Lab 03/17/22 1616 03/18/22 0632  WBC 7.8 9.2   Microbiology No results found for this or any previous visit (from the past 240 hour(s)). Imaging DG  Foot Complete Left  Result Date: 03/17/2022 CLINICAL DATA:  Foot injury of work. EXAM: LEFT FOOT - COMPLETE 3+ VIEW COMPARISON:  None Available. FINDINGS: Soft tissue wound posterior to the calcaneus  involving the heel. There is gas in the soft tissues with laceration and overlying bandage. No fracture identified. No osteomyelitis. Mild degenerative change first MTP. IMPRESSION: Soft tissue wound posterior to the calcaneus. No fracture or osteomyelitis. Electronically Signed   By: Franchot Gallo M.D.   On: 03/17/2022 16:52      Time coordinating discharge: over 30 minutes  SIGNED:  Emeterio Reeve DO [facility]

## 2022-03-19 NOTE — Progress Notes (Cosign Needed)
Patient is not able to walk the distance required to go the bathroom, or he/she is unable to safely negotiate stairs required to access the bathroom.  A 3in1 BSC will alleviate this problem  

## 2022-03-19 NOTE — Plan of Care (Signed)
  Problem: Education: Goal: Knowledge of General Education information will improve Description Including pain rating scale, medication(s)/side effects and non-pharmacologic comfort measures Outcome: Progressing   Problem: Health Behavior/Discharge Planning: Goal: Ability to manage health-related needs will improve Outcome: Progressing   

## 2022-03-19 NOTE — Evaluation (Signed)
Occupational Therapy Evaluation Patient Details Name: Rodney Callahan MRN: [id number] DOB: 12/10/1995 Today's Date: 03/19/2022   History of Present Illness Thermon Zulauf comes to [facility] on 03/17/22 after foot injury sustained via forklift, injury to posterior left heel. No acute fracture or dislocation seen on scans. Podiatry consult placed, pt taken to OR on 03/17/22 of rexploratory I&D due to deep laceration s/p crush injury. Per podistry notes, achilles complex appearing intact. Postoperatively pt is NWB. Podiatry also notes sluggish capilary refill, need to monitor healing of skin island, recommends dependent positioning of foot to maximize perfusion, and use of prevalon boot when resting to offload surture area.   Clinical Impression   Patient presenting with decreased independence in self-care, functional mobility, balance, safety, strength, and endurance. Patient's mom present in room assisting in providing home set-up and PLOF. Reporting they have a walk-in shower and comfort height toilet. Patient's bedroom is on the 2nd floor, patient mom is requesting a lift chair for patient to sleep in. Hospital bed and bariatric commode chair recommended. Spoke to patient and family about purchasing bariatric tub transfer bench. Handout given. During session patient was able to perform LB dressing with min A, educated on lateral leans. Patient was able to demonstrate while adhering to non WB precaution. Patient was educated on the importance of following precautions, able to verbalize understanding. Patient left sitting EOB, call bell in reach, family present, and all needs met. Patient will benefit from acute OT to increase overall independence in the areas of ADLs, functional mobility,  in order to safely discharge home.       Recommendations for follow up therapy are one component of a multi-disciplinary discharge planning process, led by the attending physician.  Recommendations may be updated  based on patient status, additional functional criteria and insurance authorization.   Follow Up Recommendations  Home health OT    Assistance Recommended at Discharge Intermittent Supervision/Assistance  Patient can return home with the following A little help with walking and/or transfers;A little help with bathing/dressing/bathroom;Help with stairs or ramp for entrance;Assist for transportation;Assistance with cooking/housework    Functional Status Assessment  Patient has had a recent decline in their functional status and demonstrates the ability to make significant improvements in function in a reasonable and predictable amount of time.  Equipment Recommendations  Other (comment) (Bariatric commode chair and hosptial bed. Spoke to patient about purchasing bariatric tub transfer bench. Handout given.)    Recommendations for Other Services       Precautions / Restrictions Precautions Precautions: Fall;Other (comment) Precaution Comments: NWB in Left LE Required Braces or Orthoses: Other Brace (Cam Boot when ambulating.) Other Brace: Prevalon Boot when in bed resting Restrictions Weight Bearing Restrictions: Yes LLE Weight Bearing: Non weight bearing      Mobility Bed Mobility               General bed mobility comments: Patient sitting EOB upon arrival.    Transfers                   General transfer comment: not attempted      Balance Overall balance assessment: Modified Independent Sitting-balance support: Bilateral upper extremity supported Sitting balance-Leahy Scale: Normal                                     ADL either performed or assessed with clinical judgement   ADL Overall ADL's : Needs  assistance/impaired     Grooming: Oral care;Set up           Upper Body Dressing : Set up   Lower Body Dressing: Sitting/lateral leans;Minimal assistance Lower Body Dressing Details (indicate cue type and reason): assist for  prevalon boot                      Pertinent Vitals/Pain Pain Assessment Pain Assessment: No/denies pain        Extremity/Trunk Assessment Upper Extremity Assessment Upper Extremity Assessment: Overall WFL for tasks assessed   Lower Extremity Assessment Lower Extremity Assessment: Generalized weakness   Cervical / Trunk Assessment Cervical / Trunk Assessment: Normal   Communication Communication Communication: No difficulties   Cognition Arousal/Alertness: Awake/alert Behavior During Therapy: WFL for tasks assessed/performed Overall Cognitive Status: Within Functional Limits for tasks assessed                                                  Home Living Family/patient expects to be discharged to:: Private residence Living Arrangements: Parent Available Help at Discharge: Family;Available PRN/intermittently Type of Home: House Home Access: Stairs to enter [employer] of Steps: 4 Entrance Stairs-Rails: None Home Layout: Two level   Alternate Level Stairs-Rails: Right Bathroom Shower/Tub: Occupational psychologist: Handicapped height     Home Equipment: None          Prior Functioning/Environment Prior Level of Function : Independent/Modified Independent                        OT Problem List: Decreased strength;Decreased activity tolerance;Decreased safety awareness      OT Treatment/Interventions: Self-care/ADL training;Therapeutic exercise;Therapeutic activities;Patient/family education;DME and/or AE instruction    OT Goals(Current goals can be found in the care plan section) Acute Rehab OT Goals Patient Stated Goal: get better OT Goal Formulation: With patient/family Time For Goal Achievement: 04/02/22 Potential to Achieve Goals: Good ADL Goals Pt Will Perform Lower Body Bathing: with modified independence;sitting/lateral leans Pt Will Perform Lower Body Dressing: with modified  independence;sitting/lateral leans Pt Will Transfer to Toilet: bedside commode;with modified independence Pt Will Perform Toileting - Clothing Manipulation and hygiene: with modified independence;sitting/lateral leans Pt/caregiver will Perform Home Exercise Program: Increased strength;Both right and left upper extremity;With theraband;Independently  OT Frequency: Min 2X/week       AM-PAC OT "6 Clicks" Daily Activity     Outcome Measure Help from another person eating meals?: None Help from another person taking care of personal grooming?: A Little Help from another person toileting, which includes using toliet, bedpan, or urinal?: A Lot Help from another person bathing (including washing, rinsing, drying)?: A Lot Help from another person to put on and taking off regular upper body clothing?: None Help from another person to put on and taking off regular lower body clothing?: A Lot 6 Click Score: 17   End of Session Equipment Utilized During Treatment: Other (comment) (prevalon boot) Nurse Communication: Mobility status  Activity Tolerance: Patient tolerated treatment well Patient left: in bed;with call bell/phone within reach;with family/visitor present  OT Visit Diagnosis: Muscle weakness (generalized) (M62.81)                Time: 7948-0165 OT Time Calculation (min): 31 min Charges:       Tomasa Blase, OTS 03/19/2022, 1:07 PM

## 2022-03-19 NOTE — TOC Progression Note (Signed)
Transition of Care [facility]) - Progression Note    Patient Details  Name: Servando Kyllonen MRN: [id number] Date of Birth: Jul 12, 1995  Transition of [facility]) CM/SW [place], RN Phone Number: 03/19/2022, 4:32 PM  Clinical Narrative:    Secure emailed the order for the Hospital bed to [facility], they will arrange to have the bed delivered if they approve it   Expected Discharge Plan: Home/Self Care Barriers to Discharge: Continued Medical Work up  Expected Discharge Plan and Services Expected Discharge Plan: Home/Self Care   Discharge Planning Services: CM Consult                                           Social Determinants of Health (SDOH) Interventions    Readmission Risk Interventions     No data to display

## 2022-03-19 NOTE — Progress Notes (Signed)
Physical Therapy Treatment Patient Details Name: Rodney Callahan MRN: [id number] DOB: 04-Dec-1995 Today's Date: 03/19/2022   History of Present Illness 26 y/o male admitted 03/17/22 after foot injury sustained via forklift, injury to posterior left heel. No acute fracture or dislocation seen on scans. Podiatry consult placed, pt taken to OR on 03/17/22 of exploratory I&D due to deep laceration s/p crush injury. Per podistry notes, achilles complex appearing intact. Postoperatively pt is NWB. Podiatry also notes sluggish capilary refill, need to monitor healing of skin island, recommends dependent positioning of foot to maximize perfusion, and use of prevalon boot when resting to offload surture area.    PT Comments    Pt does very well with mobility, transfers, etc even from low surfaces and decreased support/arm rest assist.  He did well with both standard walker and rolling walker (both bariatric), spent a lot of time with pt and mother on how to safely manage in the home and especially with stair negotiation.  Multiple bouts with different equipment/set up and plenty of cuing and problem solving to insure he and family were comfortable and safe with the effort.    Recommendations for follow up therapy are one component of a multi-disciplinary discharge planning process, led by the attending physician.  Recommendations may be updated based on patient status, additional functional criteria and insurance authorization.  Follow Up Recommendations  Follow physician's recommendations for discharge plan and follow up therapies     Assistance Recommended at Discharge Set up Supervision/Assistance  Patient can return home with the following A little help with walking and/or transfers;A little help with bathing/dressing/bathroom;Assistance with cooking/housework;Assist for transportation;Help with stairs or ramp for entrance   Equipment Recommendations  Hospital bed (bariatric rolling walker,  knee scooter)    Recommendations for Other Services       Precautions / Restrictions Precautions Precautions: Fall Precaution Comments: NWB in Left LE Required Braces or Orthoses:  (CAM boot deferred 2/2 width of heel/bandage) Other Brace: Prevalon Boot when in bed resting Restrictions Weight Bearing Restrictions: Yes LLE Weight Bearing: Non weight bearing     Mobility  Bed Mobility Overal bed mobility: Modified Independent Bed Mobility: Supine to Sit   Sidelying to sit: Supervision       General bed mobility comments: easily able to assume sitting EOB    Transfers Overall transfer level: Modified independent Equipment used: Rolling walker (2 wheels) Transfers: Sit to/from Stand Sit to Stand: Supervision           General transfer comment: pt able to rise to standing multiple times from multiple (relatively low) surfaces.  Good ability to maintain NWBing and use of UEs    Ambulation/Gait Ambulation/Gait assistance: Supervision Gait Distance (Feet): 70 Feet Assistive device: Rolling walker (2 wheels) (bari)         General Gait Details: Pt able to maintain NWBing and maintain consistent hop-to cadence.  He did need some cuing to insure he did not try to go too fast, but ultimately was safe and confident for appropriate in-home distances.   Stairs       Number of Stairs: 12 General stair comments: bari RW, then bari SW, back poles placed 5 pin holes difference to create near-level positioning while on steps. 6 steps up/down in stairwell with each RW. Good WBing and UE control with each   Wheelchair Mobility    Modified Rankin (Stroke Patients Only)       Balance Overall balance assessment: Modified Independent Sitting-balance support: Bilateral upper extremity supported  Sitting balance-Leahy Scale: Normal                                      Cognition Arousal/Alertness: Awake/alert Behavior During Therapy: WFL for tasks  assessed/performed Overall Cognitive Status: Within Functional Limits for tasks assessed                                          Exercises      General Comments        Pertinent Vitals/Pain Pain Assessment Pain Assessment: Faces Faces Pain Scale: Hurts even more Pain Location: Pt reports feeling stitches feel tight and entire heel throbbing and painful    Home Living Family/patient expects to be discharged to:: Private residence Living Arrangements: Parent Available Help at Discharge: Family;Available PRN/intermittently Type of Home: House Home Access: Stairs to enter Entrance Stairs-Rails: None Entrance Stairs-Number of Steps: 4   Home Layout: Two level Home Equipment: None      Prior Function            PT Goals (current goals can now be found in the care plan section) Progress towards PT goals: Progressing toward goals    Frequency    7X/week      PT Plan Current plan remains appropriate;Equipment recommendations need to be updated    Co-evaluation              AM-PAC PT "6 Clicks" Mobility   Outcome Measure  Help needed turning from your back to your side while in a flat bed without using bedrails?: None Help needed moving from lying on your back to sitting on the side of a flat bed without using bedrails?: None Help needed moving to and from a bed to a chair (including a wheelchair)?: A Little Help needed standing up from a chair using your arms (e.g., wheelchair or bedside chair)?: A Little Help needed to walk in hospital room?: A Little Help needed climbing 3-5 steps with a railing? : A Little 6 Click Score: 20    End of Session Equipment Utilized During Treatment: Gait belt Activity Tolerance: Patient tolerated treatment well;No increased pain;Patient limited by fatigue Patient left: in bed;with family/visitor present;with call bell/phone within reach Nurse Communication: Mobility status PT Visit Diagnosis: Unsteadiness  on feet (R26.81);Muscle weakness (generalized) (M62.81);Difficulty in walking, not elsewhere classified (R26.2)     Time: [id number] PT Time Calculation (min) (ACUTE ONLY): 75 min  Charges:  $Gait Training: 38-52 mins $Therapeutic Activity: 8-22 mins                     Kreg Shropshire, DPT 03/19/2022, 2:10 PM

## 2022-03-19 NOTE — Progress Notes (Signed)
PODIATRY / FOOT AND ANKLE SURGERY PROGRESS NOTE  Requesting Physician: Dr. Joylene Igo  Reason for consult: Left foot laceration  Chief Complaint: Left foot pain/open wound after crush injury   HPI: Rodney Callahan is a 26 y.o. male who presents today resting in bed comfortably with leg held in a dependent position left side.  Patient notes that every now and then he has some sharp pain in the heel but states that this pretty well controlled with the pain medication he is currently taking.  PMHx:  Past Medical History:  Diagnosis Date   Amblyopia of left eye    Astigmatism    DVD (dissociated vertical deviation)    Right Eye   Exotropia    Foreign body of esophagus    Myopia    Nystagmus    Ptosis of left eyelid     Surgical Hx:  Past Surgical History:  Procedure Laterality Date   dysphagia     ESOPHAGOGASTRODUODENOSCOPY N/A 11/21/2015   Procedure: ESOPHAGOGASTRODUODENOSCOPY (EGD);  Surgeon: Scot Jun, MD;  Location: [facility] ENDOSCOPY;  Service: Endoscopy;  Laterality: N/A;   ESOPHAGOGASTRODUODENOSCOPY (EGD) WITH PROPOFOL N/A 01/03/2015   Procedure: ESOPHAGOGASTRODUODENOSCOPY (EGD) WITH PROPOFOL;  Surgeon: Scot Jun, MD;  Location: [facility] ENDOSCOPY;  Service: Endoscopy;  Laterality: N/A;   ESOPHAGOGASTRODUODENOSCOPY (EGD) WITH PROPOFOL N/A 04/29/2015   Procedure: ESOPHAGOGASTRODUODENOSCOPY (EGD) WITH PROPOFOL;  Surgeon: Scot Jun, MD;  Location: [facility] ENDOSCOPY;  Service: Endoscopy;  Laterality: N/A;   ESOPHAGOGASTRODUODENOSCOPY (EGD) WITH PROPOFOL N/A 02/10/2016   Procedure: ESOPHAGOGASTRODUODENOSCOPY (EGD) WITH PROPOFOL;  Surgeon: Scot Jun, MD;  Location: [facility] ENDOSCOPY;  Service: Endoscopy;  Laterality: N/A;   EYE SURGERY     eyelid surgery; frontalis suspension   EYE SURGERY     strabismus eye surgery; bilat; lateral rectus recession   EYE SURGERY     strabismus eye surgery; bilat; superior recuts recessions - OU; medial rectus resection - OS    EYE SURGERY     strabismus eye surgery; Left; inferior oblique anteriorization w/dissection of scar tissue   FRONTALIS SUSPENSION     IRRIGATION AND DEBRIDEMENT FOOT Left 03/17/2022   Procedure: IRRIGATION AND DEBRIDEMENT FOOT; CLOSURE OF SOFT TISSUE LACERATION;  Surgeon: Rosetta Posner, DPM;  Location: [facility] ORS;  Service: Podiatry;  Laterality: Left;   SAVORY DILATION N/A 04/29/2015   Procedure: SAVORY DILATION;  Surgeon: Scot Jun, MD;  Location[facility] ENDOSCOPY;  Service: Endoscopy;  Laterality: N/A;   STRABISMUS SURGERY     TONSILLECTOMY      FHx:  Family History  Problem Relation Age of Onset   ADD / ADHD Mother    Osteoarthritis Father     Social History:  reports that he has never smoked. He has never used smokeless tobacco. He reports current alcohol use. He reports that he does not use drugs.  Allergies:  Allergies  Allergen Reactions   Other Swelling, Rash and Anaphylaxis    Reactive agents: Jim's Barbeque Sauce & Lemon Pepper LEMON PEPPER   Acyclovir And Related    Peach Flavor Hives   Medications Prior to Admission  Medication Sig Dispense Refill   amoxicillin-clavulanate (AUGMENTIN) 600-42.9 MG/5ML suspension 10 ml bid x 10 days (Patient not taking: Reported on 03/18/2022) 200 mL 0   benzonatate (TESSALON) 100 MG capsule Take 2 capsules (200 mg total) by mouth every 8 (eight) hours. (Patient not taking: Reported on 03/18/2022) 21 capsule 0   ipratropium (ATROVENT) 0.06 % nasal spray Place 2 sprays into both nostrils 4 (  four) times daily. (Patient not taking: Reported on 03/18/2022) 15 mL 12   mupirocin ointment (BACTROBAN) 2 % Apply 1 application topically 2 (two) times daily. (Patient not taking: Reported on 03/18/2022) 22 g 0   promethazine-dextromethorphan (PROMETHAZINE-DM) 6.25-15 MG/5ML syrup Take 5 mLs by mouth 4 (four) times daily as needed. (Patient not taking: Reported on 03/18/2022) 118 mL 0     Physical Exam: General: Alert and oriented.  No apparent  distress.  Vascular: DP/PT pulses palpable bilateral.  Capillary fill time appears to be intact to digits bilateral, but somewhat sluggish to plantar heel flap.  Hair growth present to digits bilateral lower extremities.  Neuro: Light touch sensation intact to digits bilaterally.  Derm: Laceration site appears to be well coapted with sutures intact, capillary fill time appears to be intact to skin flap overall, minimal tension apparent on skin flap.  Appears to be greatly improved overall compared to yesterday.  Mild edema, minimal to no erythema, no drainage.  Plantar portion of the flap has some hemorrhagic appearance as the skin was cut and an angled orientation with the injury.  Skin flap overall appears to be viable.    MSK: Pain on palpation to the left heel  Results for orders placed or performed during the hospital encounter of 03/17/22 (from the past 48 hour(s))  CBC     Status: Abnormal   Collection Time: 03/17/22  4:16 PM  Result Value Ref Range   WBC 7.8 4.0 - 10.5 K/uL   RBC 6.01 (H) 4.22 - 5.81 MIL/uL   Hemoglobin 14.7 13.0 - 17.0 g/dL   HCT 46.0 39.0 - 52.0 %   MCV 76.5 (L) 80.0 - 100.0 fL   MCH 24.5 (L) 26.0 - 34.0 pg   MCHC 32.0 30.0 - 36.0 g/dL   RDW 15.3 11.5 - 15.5 %   Platelets 303 150 - 400 K/uL   nRBC 0.0 0.0 - 0.2 %    Comment: Performed at [facility], [address] [place], [place] [address]  Basic metabolic panel     Status: None   Collection Time: 03/17/22  4:16 PM  Result Value Ref Range   Sodium 140 135 - 145 mmol/L   Potassium 3.8 3.5 - 5.1 mmol/L   Chloride 106 98 - 111 mmol/L   CO2 28 22 - 32 mmol/L   Glucose, Bld 99 70 - 99 mg/dL    Comment: Glucose reference range applies only to samples taken after fasting for at least 8 hours.   BUN 13 6 - 20 mg/dL   Creatinine, Ser 1.05 0.61 - 1.24 mg/dL   Calcium 9.3 8.9 - 10.3 mg/dL   GFR, Estimated >60 >60 mL/min    Comment: (NOTE) Calculated using the CKD-EPI Creatinine Equation (2021)     Anion gap 6 5 - 15    Comment: Performed at [facility], [address] [place], [place] [address]  Iron and TIBC     Status: Abnormal   Collection Time: 03/18/22  6:19 AM  Result Value Ref Range   Iron 51 45 - 182 ug/dL   TIBC 364 250 - 450 ug/dL   Saturation Ratios 14 (L) 17.9 - 39.5 %   UIBC 313 ug/dL    Comment: Performed at [facility], [address] [place], [place] [address]  Ferritin     Status: None   Collection Time: 03/18/22  6:19 AM  Result Value Ref Range   Ferritin 32 24 - 336 ng/mL  Comment: Performed at [facility], [address] [place], [place] [address]  CBC     Status: Abnormal   Collection Time: 03/18/22  6:32 AM  Result Value Ref Range   WBC 9.2 4.0 - 10.5 K/uL   RBC 5.73 4.22 - 5.81 MIL/uL   Hemoglobin 14.1 13.0 - 17.0 g/dL   HCT 17.6 16.0 - 73.7 %   MCV 76.4 (L) 80.0 - 100.0 fL   MCH 24.6 (L) 26.0 - 34.0 pg   MCHC 32.2 30.0 - 36.0 g/dL   RDW 10.6 26.9 - 48.5 %   Platelets 294 150 - 400 K/uL   nRBC 0.0 0.0 - 0.2 %    Comment: Performed at [facility], [address] [place], [place] [address]  Basic metabolic panel     Status: Abnormal   Collection Time: 03/18/22  6:32 AM  Result Value Ref Range   Sodium 140 135 - 145 mmol/L   Potassium 4.3 3.5 - 5.1 mmol/L   Chloride 106 98 - 111 mmol/L   CO2 26 22 - 32 mmol/L   Glucose, Bld 139 (H) 70 - 99 mg/dL    Comment: Glucose reference range applies only to samples taken after fasting for at least 8 hours.   BUN 14 6 - 20 mg/dL   Creatinine, Ser 3.50 0.61 - 1.24 mg/dL   Calcium 9.4 8.9 - 09.3 mg/dL   GFR, Estimated >81 >82 mL/min    Comment: (NOTE) Calculated using the CKD-EPI Creatinine Equation (2021)    Anion gap 8 5 - 15    Comment: Performed at [facility], [address] [place], [place] [address]  HIV Antibody (routine testing w rflx)     Status: None   Collection Time: 03/18/22  6:32 AM  Result Value Ref Range   HIV Screen 4th  Generation wRfx Non Reactive Non Reactive    Comment: Performed at [facility], [address], [place], [place] [address]   DG Foot Complete Left  Result Date: 03/17/2022 CLINICAL DATA:  Foot injury of work. EXAM: LEFT FOOT - COMPLETE 3+ VIEW COMPARISON:  None Available. FINDINGS: Soft tissue wound posterior to the calcaneus involving the heel. There is gas in the soft tissues with laceration and overlying bandage. No fracture identified. No osteomyelitis. Mild degenerative change first MTP. IMPRESSION: Soft tissue wound posterior to the calcaneus. No fracture or osteomyelitis. Electronically Signed   By: Marlan Palau M.D.   On: 03/17/2022 16:52    Blood pressure 119/67, pulse (!) 57, temperature 98.4 F (36.9 C), resp. rate 18, height 6\' 7"  (2.007 m), weight (!) 163.3 kg, SpO2 97 %.   Assessment Deep laceration present to the left posterior and plantar heel Left foot crush injury  Plan -Patient seen and examined -Laceration site appears to be well coapted with sutures intact.  Do not see any evidence of necrosis.  Skin flaps appear to be healthy and viable. -Redressed today with Xeroform to the incision line followed by 4 x 4 gauze, ABD, Kerlix, Webril, posterior splint, Ace wrap.  Foot held in resting plantarflexion to reduce tension on back of heel.  Patient to keep dressings clean, dry, and intact until postoperative visit in outpatient clinic. -Patient is to be nonweightbearing at all times to the left lower extremity.  Discussed with patient he will likely have to be this way for potentially 4 to 6 weeks after the procedure. -Would still recommend for the next week or so keeping the  foot in a slightly dependent position to improve circulation to foot. -Appreciate medicine recommendations for pain management.  Recommend upon discharge 7-day course of Percocet 5/325 1 p.o. every 6 hours as needed pain, 28 tablets for moderate to severe pain.  Patient may also additionally take  Tylenol as needed and ibuprofen for mild pain. -Appreciate PT recommendations.  Patient requested prescription for knee scooter.  Podiatry team to sign off at this time.  Patient to follow-up in outpatient clinic in 1 week for further assessment of wound.  Rosetta Posner, DPM 03/19/2022, 1:15 PM

## 2022-10-14 IMAGING — CR DG ANKLE COMPLETE 3+V*L*
3 series · 3 of 3 positions shown · non-contrast
Comparison: Left tibia and fibula x-rays dated August 17, 2011.

CLINICAL DATA: Medial ankle ulcer fro 6 weeks.

EXAM:
LEFT ANKLE COMPLETE - 3+ VIEW

[ankle ap]
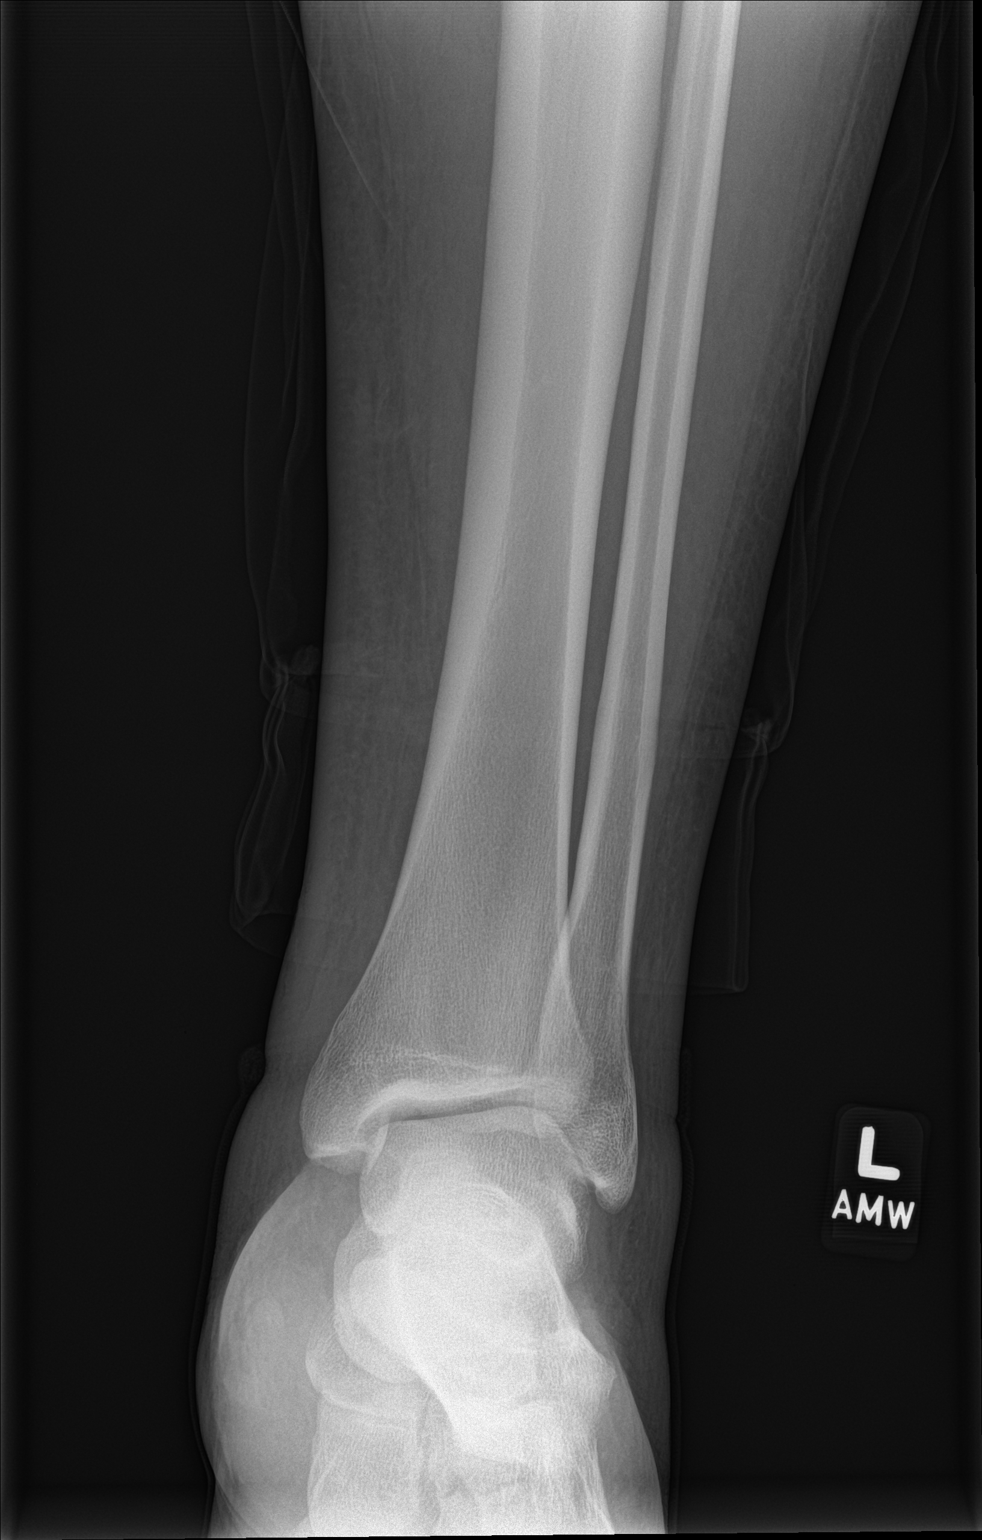

[ankle obl]
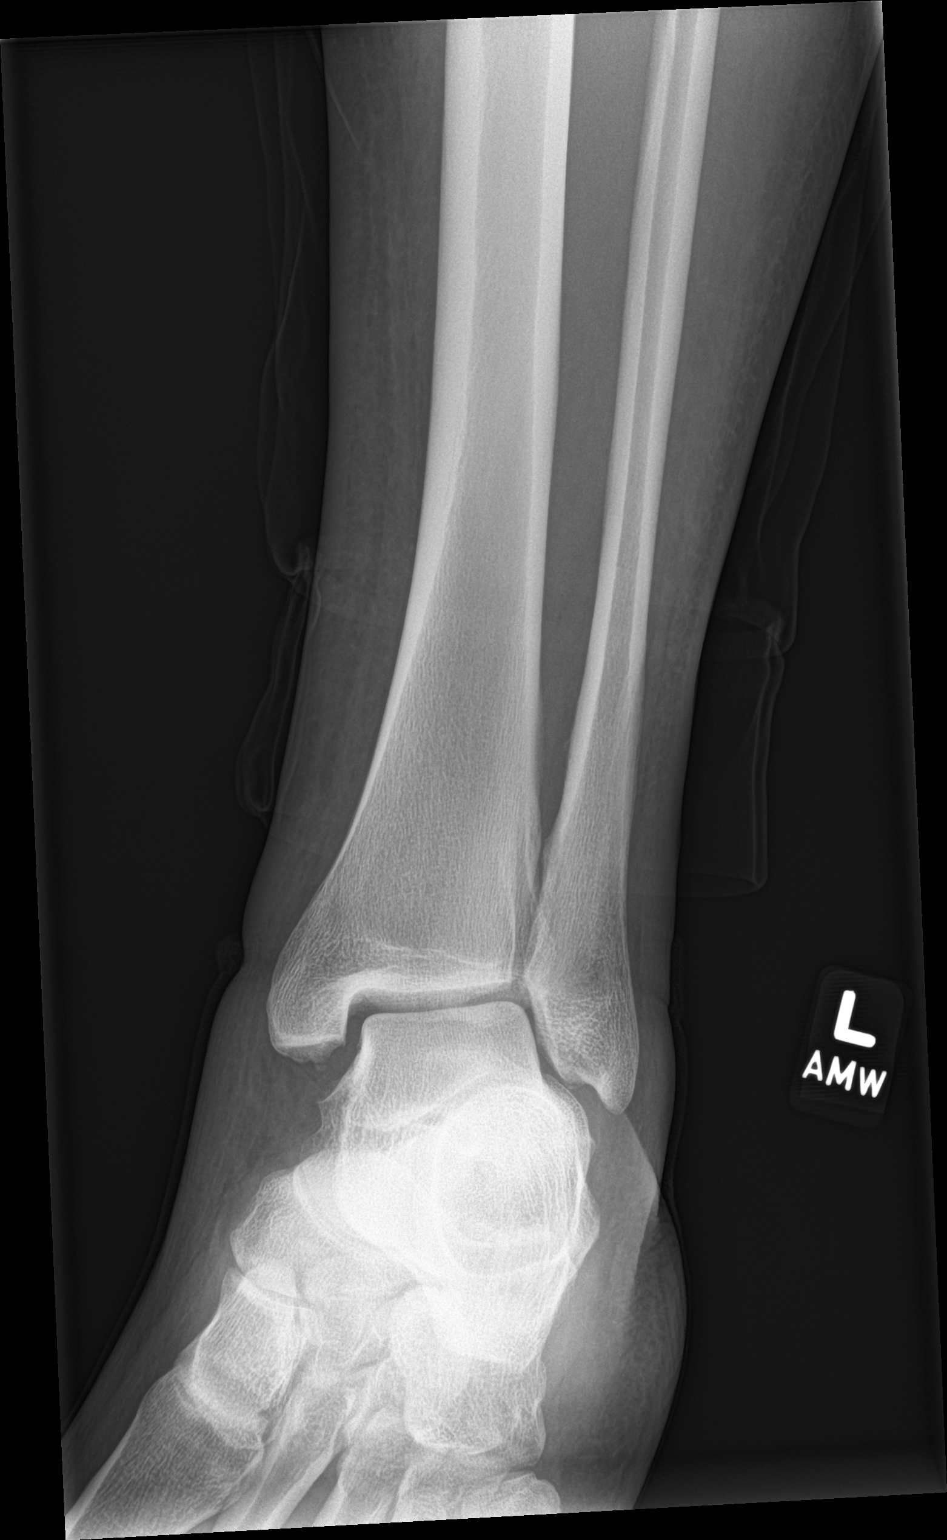

[ankle lat]
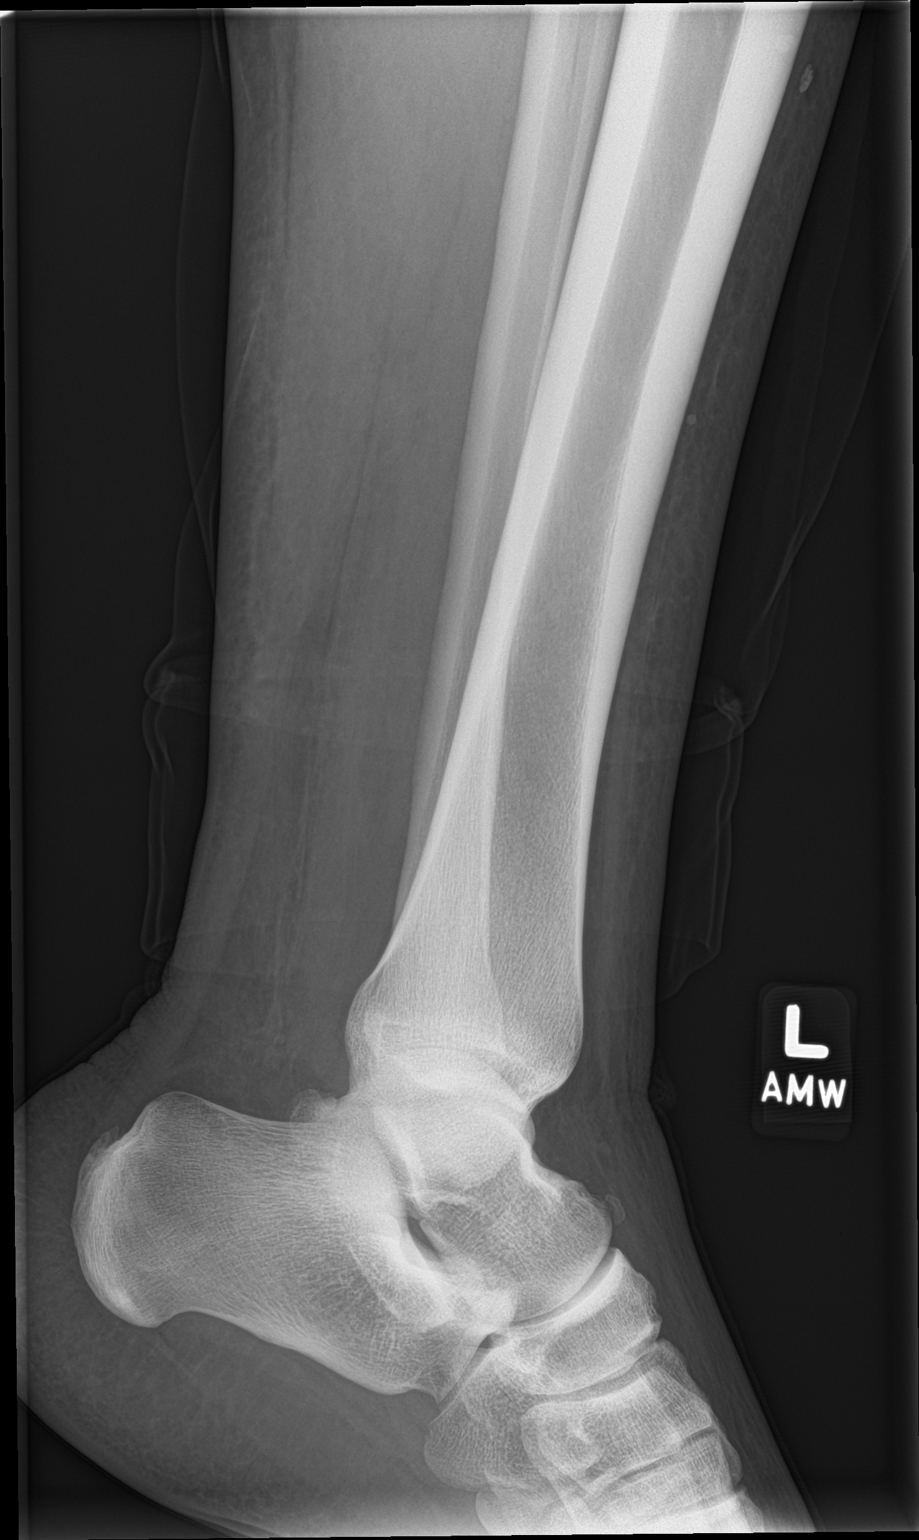

[3 of 3 positions shown; findings below may reference images not displayed]

FINDINGS: No acute fracture or dislocation. No bony destruction or periosteal
reaction. Joint spaces are preserved. Bone mineralization is normal.
Diffuse soft tissue swelling.
IMPRESSION: 1. Diffuse soft tissue swelling. No acute osseous abnormality.

## 2022-12-09 ENCOUNTER — Ambulatory Visit
Admission: EM | Admit: 2022-12-09 | Discharge: 2022-12-09 | Disposition: A | Discharge status: Home / Self Care | Payer: BC Managed Care – PPO | Attending: Family Medicine | Admitting: Family Medicine

## 2022-12-09 DIAGNOSIS — L03119 Cellulitis of unspecified part of limb: Secondary | ICD-10-CM

## 2022-12-09 MED ORDER — DOXYCYCLINE HYCLATE 100 MG PO CAPS: 100.0000 mg | ORAL_CAPSULE | Freq: Two times a day (BID) | ORAL | 0 refills | Status: DC

## 2022-12-09 NOTE — ED Provider Notes (Signed)
[facility]    CSN: [id number] Arrival date & time: 12/09/22  0802      History   Chief Complaint Chief Complaint  Patient presents with   Cellulitis    HPI Rodney Callahan is a 27 y.o. male.   HPI  Rodney Callahan presents for concern for skin infection that started last Thursday.  Has rash on both lower legs. He thought it was his new work boots.  The skin is red and itchy.  As soon as he gets home for work be ices the areas. Has not taken anything for pain.   No fever or other concerns today.  Denies any insect bites or working outside.    Past Medical History:  Diagnosis Date   Amblyopia of left eye    Astigmatism    DVD (dissociated vertical deviation)    Right Eye   Exotropia    Foreign body of esophagus    Myopia    Nystagmus    Ptosis of left eyelid     Patient Active Problem List   Diagnosis Date Noted   Laceration with crush injury of heel, left, initial encounter 03/17/2022   Obesity, Class III, BMI 40-49.9 (morbid obesity) (HCC) 03/17/2022   History of esophageal stricture/dysphagia with 'ringed esophagus' on EGD 2017 03/17/2022   History of asthma 03/17/2022   Elevated blood-pressure reading without diagnosis of hypertension 03/17/2022    Past Surgical History:  Procedure Laterality Date   dysphagia     ESOPHAGOGASTRODUODENOSCOPY N/A 11/21/2015   Procedure: ESOPHAGOGASTRODUODENOSCOPY (EGD);  Surgeon: Scot Jun, MD;  Location: [facility] ENDOSCOPY;  Service: Endoscopy;  Laterality: N/A;   ESOPHAGOGASTRODUODENOSCOPY (EGD) WITH PROPOFOL N/A 01/03/2015   Procedure: ESOPHAGOGASTRODUODENOSCOPY (EGD) WITH PROPOFOL;  Surgeon: Scot Jun, MD;  Location: [facility] ENDOSCOPY;  Service: Endoscopy;  Laterality: N/A;   ESOPHAGOGASTRODUODENOSCOPY (EGD) WITH PROPOFOL N/A 04/29/2015   Procedure: ESOPHAGOGASTRODUODENOSCOPY (EGD) WITH PROPOFOL;  Surgeon: Scot Jun, MD;  Location: [facility] ENDOSCOPY;  Service: Endoscopy;  Laterality: N/A;    ESOPHAGOGASTRODUODENOSCOPY (EGD) WITH PROPOFOL N/A 02/10/2016   Procedure: ESOPHAGOGASTRODUODENOSCOPY (EGD) WITH PROPOFOL;  Surgeon: Scot Jun, MD;  Location: [facility] ENDOSCOPY;  Service: Endoscopy;  Laterality: N/A;   EYE SURGERY     eyelid surgery; frontalis suspension   EYE SURGERY     strabismus eye surgery; bilat; lateral rectus recession   EYE SURGERY     strabismus eye surgery; bilat; superior recuts recessions - OU; medial rectus resection - OS   EYE SURGERY     strabismus eye surgery; Left; inferior oblique anteriorization w/dissection of scar tissue   FRONTALIS SUSPENSION     IRRIGATION AND DEBRIDEMENT FOOT Left 03/17/2022   Procedure: IRRIGATION AND DEBRIDEMENT FOOT; CLOSURE OF SOFT TISSUE LACERATION;  Surgeon: Rosetta Posner, DPM;  Location: [facility] ORS;  Service: Podiatry;  Laterality: Left;   SAVORY DILATION N/A 04/29/2015   Procedure: SAVORY DILATION;  Surgeon: Scot Jun, MD;  Location: [facility] ENDOSCOPY;  Service: Endoscopy;  Laterality: N/A;   STRABISMUS SURGERY     TONSILLECTOMY         Home Medications    Prior to Admission medications   Medication Sig Start Date End Date Taking? Authorizing Provider  doxycycline (VIBRAMYCIN) 100 MG capsule Take 1 capsule (100 mg total) by mouth 2 (two) times daily. 12/09/22  Yes Ireanna Finlayson, DO  albuterol (VENTOLIN HFA) 108 (90 Base) MCG/ACT inhaler Inhale 1-2 puffs into the lungs every 6 (six) hours as needed for wheezing or shortness of breath. 01/27/19 06/22/19  Domenick Gong, MD  fluticasone [facility]) 50 MCG/ACT nasal spray Place 2 sprays into both nostrils daily. 01/27/19 06/22/19  Domenick Gong, MD  omeprazole (PRILOSEC) 40 MG capsule Take 40 mg by mouth daily.  01/27/19  [provider]    Family History Family History  Problem Relation Age of Onset   ADD / ADHD Mother    Osteoarthritis Father     Social History Social History   Tobacco Use   Smoking status: Never   Smokeless tobacco: Never   Vaping Use   Vaping status: Never Used  Substance Use Topics   Alcohol use: Yes    Comment: socially   Drug use: No     Allergies   Other, Acyclovir and related, and Peach flavor   Review of Systems Review of Systems :negative unless otherwise stated in HPI.      Physical Exam Triage Vital Signs ED Triage Vitals  Encounter Vitals Group     BP 12/09/22 0817 129/84     Systolic BP Percentile --      Diastolic BP Percentile --      Pulse Rate 12/09/22 0817 85     Resp 12/09/22 0817 16     Temp 12/09/22 0817 98.9 F (37.2 C)     Temp Source 12/09/22 0817 Oral     SpO2 12/09/22 0817 95 %     Weight 12/09/22 0813 (!) 365 lb (165.6 kg)     Height 12/09/22 0813 6\' 7"  (2.007 m)     Head Circumference --      Peak Flow --      Pain Score 12/09/22 0824 8     Pain Loc --      Pain Education --      Exclude from Growth Chart --    No data found.  Updated Vital Signs BP 129/84 (BP Location: Right Arm)   Pulse 85   Temp 98.9 F (37.2 C) (Oral)   Resp 16   Ht 6\' 7"  (2.007 m)   Wt (!) 165.6 kg   SpO2 95%   BMI 41.12 kg/m   Visual Acuity Right Eye Distance:   Left Eye Distance:   Bilateral Distance:    Right Eye Near:   Left Eye Near:    Bilateral Near:     Physical Exam  GEN: alert, male, in no acute distress  EYES:  no scleral injection or discharge  CV: regular rate, strong DP pulses  RESP: no increased work of breathing MSK:  1+ extremity edema on right and trace on the left, good ROM of ankle, knee and hip  NEURO: alert, moves all extremities appropriately SKIN: warm and dry; erythematous patches with some areas of pustules with surrounding induration, no discharge, right knee abrasion with scaling     UC Treatments / Results  Labs (all labs ordered are listed, but only abnormal results are displayed) Labs Reviewed - No data to display  EKG   Radiology No results found.  Procedures Procedures (including critical care time)  Medications  Ordered in UC Medications - No data to display  Initial Impression / Assessment and Plan / UC Course  I have reviewed the triage vital signs and the nursing notes.  Pertinent labs & imaging results that were available during my care of the patient were reviewed by me and considered in my medical decision making (see chart for details).     Patient is a 27 y.o. malewho presents for rash.  Overall, patient is well-appearing  and well-hydrated.  Vital signs stable.  Rodney Callahan is afebrile.  Exam concerning for cellulitis. Redness outlined with skin marker.   Treat with doxycycline BID for 10 days.  Considered insect bites and contact dermatitis but exam more consistent with cellulitis.   Reviewed expectations regarding course of current medical issues.  All questions asked were answered.  Outlined signs and symptoms indicating need for more acute intervention. Patient verbalized understanding. After Visit Summary given.   Final Clinical Impressions(s) / UC Diagnoses   Final diagnoses:  Cellulitis of lower extremity, unspecified laterality     Discharge Instructions      Stop by the pharmacy to pick up your prescriptions.  Follow up with your primary care provider as needed.  If redness or pain worsens after 48 hours, go to the emergency department.        ED Prescriptions     Medication Sig Dispense Auth. Provider   doxycycline (VIBRAMYCIN) 100 MG capsule Take 1 capsule (100 mg total) by mouth 2 (two) times daily. 20 capsule Katha Cabal, DO      PDMP not reviewed this encounter.              Katha Cabal, DO 12/09/22 [phone]

## 2022-12-09 NOTE — ED Triage Notes (Signed)
Pt c/o cellulitis in bilateral legs x6 days. States legs tender to touch & burning. Has tried ice w/o relief.

## 2022-12-09 NOTE — Discharge Instructions (Addendum)
Stop by the pharmacy to pick up your prescriptions.  Follow up with your primary care provider as needed.  If redness or pain worsens after 48 hours, go to the emergency department.

## 2022-12-14 ENCOUNTER — Ambulatory Visit
Admission: EM | Admit: 2022-12-14 | Discharge: 2022-12-14 | Disposition: A | Discharge status: Home / Self Care | Payer: Self-pay

## 2022-12-14 DIAGNOSIS — L03116 Cellulitis of left lower limb: Secondary | ICD-10-CM

## 2022-12-14 DIAGNOSIS — L03115 Cellulitis of right lower limb: Secondary | ICD-10-CM

## 2022-12-14 NOTE — ED Triage Notes (Signed)
Pt came Thursday and was dx with cellulitis   in his leg. Pain is gone but redness is getting worse.

## 2022-12-14 NOTE — Discharge Instructions (Signed)
Wear support soaks every night you have to work and follow up with your primary care doctor so you can see a vascualr specialist

## 2022-12-14 NOTE — ED Provider Notes (Signed)
[facility]    CSN: [id number] Arrival date & time: 12/14/22  4098      History   Chief Complaint No chief complaint on file.   HPI Rodney Callahan is a 27 y.o. male who presents with persistent redness of of his R posterior leg when he gets off work, but pain has improved a lot. Has been taking the Doxy as prescribed. Denies fever.     Past Medical History:  Diagnosis Date   Amblyopia of left eye    Astigmatism    DVD (dissociated vertical deviation)    Right Eye   Exotropia    Foreign body of esophagus    Myopia    Nystagmus    Ptosis of left eyelid     Patient Active Problem List   Diagnosis Date Noted   Laceration with crush injury of heel, left, initial encounter 03/17/2022   Obesity, Class III, BMI 40-49.9 (morbid obesity) (HCC) 03/17/2022   History of esophageal stricture/dysphagia with 'ringed esophagus' on EGD 2017 03/17/2022   History of asthma 03/17/2022   Elevated blood-pressure reading without diagnosis of hypertension 03/17/2022    Past Surgical History:  Procedure Laterality Date   dysphagia     ESOPHAGOGASTRODUODENOSCOPY N/A 11/21/2015   Procedure: ESOPHAGOGASTRODUODENOSCOPY (EGD);  Surgeon: Scot Jun, MD;  Location: [facility] ENDOSCOPY;  Service: Endoscopy;  Laterality: N/A;   ESOPHAGOGASTRODUODENOSCOPY (EGD) WITH PROPOFOL N/A 01/03/2015   Procedure: ESOPHAGOGASTRODUODENOSCOPY (EGD) WITH PROPOFOL;  Surgeon: Scot Jun, MD;  Location: [facility] ENDOSCOPY;  Service: Endoscopy;  Laterality: N/A;   ESOPHAGOGASTRODUODENOSCOPY (EGD) WITH PROPOFOL N/A 04/29/2015   Procedure: ESOPHAGOGASTRODUODENOSCOPY (EGD) WITH PROPOFOL;  Surgeon: Scot Jun, MD;  Location: [facility] ENDOSCOPY;  Service: Endoscopy;  Laterality: N/A;   ESOPHAGOGASTRODUODENOSCOPY (EGD) WITH PROPOFOL N/A 02/10/2016   Procedure: ESOPHAGOGASTRODUODENOSCOPY (EGD) WITH PROPOFOL;  Surgeon: Scot Jun, MD;  Location: [facility] ENDOSCOPY;  Service: Endoscopy;  Laterality:  N/A;   EYE SURGERY     eyelid surgery; frontalis suspension   EYE SURGERY     strabismus eye surgery; bilat; lateral rectus recession   EYE SURGERY     strabismus eye surgery; bilat; superior recuts recessions - OU; medial rectus resection - OS   EYE SURGERY     strabismus eye surgery; Left; inferior oblique anteriorization w/dissection of scar tissue   FRONTALIS SUSPENSION     IRRIGATION AND DEBRIDEMENT FOOT Left 03/17/2022   Procedure: IRRIGATION AND DEBRIDEMENT FOOT; CLOSURE OF SOFT TISSUE LACERATION;  Surgeon: Rosetta Posner, DPM;  Location: [facility] ORS;  Service: Podiatry;  Laterality: Left;   SAVORY DILATION N/A 04/29/2015   Procedure: SAVORY DILATION;  Surgeon: Scot Jun, MD;  Location: [facility] ENDOSCOPY;  Service: Endoscopy;  Laterality: N/A;   STRABISMUS SURGERY     TONSILLECTOMY         Home Medications    Prior to Admission medications   Medication Sig Start Date End Date Taking? Authorizing Provider  doxycycline (VIBRAMYCIN) 100 MG capsule Take 1 capsule (100 mg total) by mouth 2 (two) times daily. 12/09/22  Yes Brimage, Vondra, DO  albuterol (VENTOLIN HFA) 108 (90 Base) MCG/ACT inhaler Inhale 1-2 puffs into the lungs every 6 (six) hours as needed for wheezing or shortness of breath. 01/27/19 06/22/19  Domenick Gong, MD  fluticasone (FLONASE) 50 MCG/ACT nasal spray Place 2 sprays into both nostrils daily. 01/27/19 06/22/19  Domenick Gong, MD  omeprazole (PRILOSEC) 40 MG capsule Take 40 mg by mouth daily.  01/27/19  [provider]  Family History Family History  Problem Relation Age of Onset   ADD / ADHD Mother    Osteoarthritis Father     Social History Social History   Tobacco Use   Smoking status: Never   Smokeless tobacco: Never  Vaping Use   Vaping status: Never Used  Substance Use Topics   Alcohol use: Yes    Comment: socially   Drug use: No     Allergies   Other, Acyclovir and related, and Peach flavor   Review of Systems Review  of Systems   Physical Exam Triage Vital Signs ED Triage Vitals  Encounter Vitals Group     BP 12/14/22 1928 127/76     Systolic BP Percentile --      Diastolic BP Percentile --      Pulse Rate 12/14/22 1928 (!) 57     Resp 12/14/22 1928 17     Temp 12/14/22 1928 98 F (36.7 C)     Temp Source 12/14/22 1928 Oral     SpO2 12/14/22 1928 98 %     Weight 12/14/22 1927 (!) 363 lb 12.1 oz (165 kg)     Height --      Head Circumference --      Peak Flow --      Pain Score --      Pain Loc --      Pain Education --      Exclude from Growth Chart --    No data found.  Updated Vital Signs BP 127/76 (BP Location: Left Arm)   Pulse (!) 57   Temp 98 F (36.7 C) (Oral)   Resp 17   Wt (!) 363 lb 12.1 oz (165 kg)   SpO2 98%   BMI 40.98 kg/m   Visual Acuity Right Eye Distance:   Left Eye Distance:   Bilateral Distance:    Right Eye Near:   Left Eye Near:    Bilateral Near:     Physical Exam Vitals and nursing note reviewed.  Constitutional:      General: He is not in acute distress.    Appearance: He is not toxic-appearing.  HENT:     Right Ear: External ear normal.     Left Ear: External ear normal.  Eyes:     General: No scleral icterus.    Conjunctiva/sclera: Conjunctivae normal.  Pulmonary:     Effort: Pulmonary effort is normal.  Musculoskeletal:     Cervical back: Neck supple.     Right lower leg: No edema.     Left lower leg: Edema present.  Skin:    General: Skin is warm and dry.     Comments: R LOWER LEG- with erythema of 2/3 of his leg, but is not hot. The posterior upper calf has normal skin color. Has minimal pain with palpation of erythematous regions with hard palpation. Now open wounds noted. Has good capillary refill  L LOWER LEG- with 1/3 of lower leg erythema, mild warmth on medial ankle regio and mild pain. No new wounds or streaking from this area. Has venostasis changes on medial lower leg  Neurological:     Mental Status: He is alert and  oriented to person, place, and time.     Gait: Gait normal.  Psychiatric:        Mood and Affect: Mood normal.        Behavior: Behavior normal.        Thought Content: Thought content normal.  Judgment: Judgment normal.      UC Treatments / Results  Labs (all labs ordered are listed, but only abnormal results are displayed) Labs Reviewed - No data to display  EKG   Radiology No results found.  Procedures Procedures (including critical care time)  Medications Ordered in UC Medications - No data to display  Initial Impression / Assessment and Plan / UC Course  I have reviewed the triage vital signs and the nursing notes.  Bilateral cellulitis improving  I encouraged him to wear support socks which he has at home when he has to  be on his feet at work. Needs to FU with his PCP.   Final Clinical Impressions(s) / UC Diagnoses   Final diagnoses:  Bilateral lower leg cellulitis     Discharge Instructions      Wear support soaks every night you have to work and follow up with your primary care doctor so you can see a vascualr specialist      ED Prescriptions   None    PDMP not reviewed this encounter.   Garey Ham, PA-C 12/14/22 2000

## 2023-12-25 ENCOUNTER — Ambulatory Visit (INDEPENDENT_AMBULATORY_CARE_PROVIDER_SITE_OTHER)

## 2023-12-25 ENCOUNTER — Ambulatory Visit
Admission: EM | Admit: 2023-12-25 | Discharge: 2023-12-25 | Disposition: A | Discharge status: Home / Self Care | Attending: Emergency Medicine | Admitting: Emergency Medicine

## 2023-12-25 ENCOUNTER — Encounter: Payer: Self-pay | Admitting: Emergency Medicine

## 2023-12-25 DIAGNOSIS — U071 COVID-19: Secondary | ICD-10-CM | POA: Insufficient documentation

## 2023-12-25 DIAGNOSIS — R051 Acute cough: Secondary | ICD-10-CM | POA: Insufficient documentation

## 2023-12-25 LAB — SARS CORONAVIRUS 2 BY RT PCR: SARS Coronavirus 2 by RT PCR: POSITIVE — AB

## 2023-12-25 MED ORDER — BENZONATATE 100 MG PO CAPS: 200.0000 mg | ORAL_CAPSULE | Freq: Three times a day (TID) | ORAL | 0 refills | Status: AC

## 2023-12-25 MED ORDER — IPRATROPIUM BROMIDE 0.06 % NA SOLN: 2.0000 | Freq: Four times a day (QID) | NASAL | 12 refills | Status: AC

## 2023-12-25 MED ORDER — AEROCHAMBER MV MISC: 2 refills | Status: AC

## 2023-12-25 MED ORDER — ALBUTEROL SULFATE HFA 108 (90 BASE) MCG/ACT IN AERS: 2.0000 | INHALATION_SPRAY | RESPIRATORY_TRACT | 0 refills | Status: AC | PRN

## 2023-12-25 MED ORDER — PROMETHAZINE-DM 6.25-15 MG/5ML PO SYRP: 5.0000 mL | ORAL_SOLUTION | Freq: Four times a day (QID) | ORAL | 0 refills | Status: AC | PRN

## 2023-12-25 NOTE — ED Provider Notes (Signed)
 [facility]    CSN: [id number] Arrival date & time: 12/25/23  9176      History   Chief Complaint Chief Complaint  Patient presents with   Cough   Shortness of Breath    HPI Rodney Callahan is a 28 y.o. male.   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  These include sinus pain and pressure, nasal congestion, ear pressure, cough, and shortness of breath.  His cough is nonproductive.  He is also endorsing some chest tightness.  He denies any sore throat or wheezing.  He traveled last week to [place] and several members of his party have also been sick with respiratory illnesses.  He is unaware of other sick contacts.  Past Medical History:  Diagnosis Date   Amblyopia of left eye    Astigmatism    DVD (dissociated vertical deviation)    Right Eye   Exotropia    Foreign body of esophagus    Myopia    Nystagmus    Ptosis of left eyelid     Patient Active Problem List   Diagnosis Date Noted   Laceration with crush injury of heel, left, initial encounter 03/17/2022   Obesity, Class III, BMI 40-49.9 (morbid obesity) 03/17/2022   History of esophageal stricture/dysphagia with 'ringed esophagus' on EGD 2017 03/17/2022   History of asthma 03/17/2022   Elevated blood-pressure reading without diagnosis of hypertension 03/17/2022    Past Surgical History:  Procedure Laterality Date   dysphagia     ESOPHAGOGASTRODUODENOSCOPY N/A 11/21/2015   Procedure: ESOPHAGOGASTRODUODENOSCOPY (EGD);  Surgeon: Lamar ONEIDA Holmes, MD;  Location: [facility] ENDOSCOPY;  Service: Endoscopy;  Laterality: N/A;   ESOPHAGOGASTRODUODENOSCOPY (EGD) WITH PROPOFOL  N/A 01/03/2015   Procedure: ESOPHAGOGASTRODUODENOSCOPY (EGD) WITH PROPOFOL ;  Surgeon: Lamar ONEIDA Holmes, MD;  Location: [facility] ENDOSCOPY;  Service:  Endoscopy;  Laterality: N/A;   ESOPHAGOGASTRODUODENOSCOPY (EGD) WITH PROPOFOL  N/A 04/29/2015   Procedure: ESOPHAGOGASTRODUODENOSCOPY (EGD) WITH PROPOFOL ;  Surgeon: Lamar ONEIDA Holmes, MD;  Location: [facility] ENDOSCOPY;  Service: Endoscopy;  Laterality: N/A;   ESOPHAGOGASTRODUODENOSCOPY (EGD) WITH PROPOFOL  N/A 02/10/2016   Procedure: ESOPHAGOGASTRODUODENOSCOPY (EGD) WITH PROPOFOL ;  Surgeon: Lamar ONEIDA Holmes, MD;  Location: [facility] ENDOSCOPY;  Service: Endoscopy;  Laterality: N/A;   EYE SURGERY     eyelid surgery; frontalis suspension   EYE SURGERY     strabismus eye surgery; bilat; lateral rectus recession   EYE SURGERY     strabismus eye surgery; bilat; superior recuts recessions - OU; medial rectus resection - OS   EYE SURGERY     strabismus eye surgery; Left; inferior oblique anteriorization w/dissection of scar tissue   FRONTALIS SUSPENSION     IRRIGATION AND DEBRIDEMENT FOOT Left 03/17/2022   Procedure: IRRIGATION AND DEBRIDEMENT FOOT; CLOSURE OF SOFT TISSUE LACERATION;  Surgeon: Lennie Barter, DPM;  Location: [facility] ORS;  Service: Podiatry;  Laterality: Left;   SAVORY DILATION N/A 04/29/2015   Procedure: SAVORY DILATION;  Surgeon: Lamar ONEIDA Holmes, MD;  Location: [facility] ENDOSCOPY;  Service: Endoscopy;  Laterality: N/A;   STRABISMUS SURGERY     TONSILLECTOMY         Home Medications    Prior to Admission medications   Medication Sig Start Date End Date Taking? Authorizing Provider  cephALEXin (KEFLEX) 500 MG capsule Take 1,000 mg by mouth 2 (two) times daily. 11/22/23  Yes [provider]  levocetirizine (XYZAL) 5 MG tablet Take 5 mg by mouth daily. 11/22/23  Yes [provider]  albuterol  (VENTOLIN  HFA) 108 (90 Base) MCG/ACT inhaler Inhale 2 puffs into the lungs every 4 (four) hours as needed. 12/25/23  Yes Bernardino Ditch, NP  benzonatate  (TESSALON ) 100 MG capsule Take 2 capsules (200 mg total) by mouth every 8 (eight) hours. 12/25/23  Yes Bernardino Ditch, NP  ipratropium (ATROVENT ) 0.06  % nasal spray Place 2 sprays into both nostrils 4 (four) times daily. 12/25/23  Yes Bernardino Ditch, NP  promethazine -dextromethorphan (PROMETHAZINE -DM) 6.25-15 MG/5ML syrup Take 5 mLs by mouth 4 (four) times daily as needed. 12/25/23  Yes Bernardino Ditch, NP  Spacer/Aero-Holding Chambers (AEROCHAMBER MV) inhaler Use as instructed 12/25/23  Yes Bernardino Ditch, NP  fluticasone  (FLONASE ) 50 MCG/ACT nasal spray Place 2 sprays into both nostrils daily. 01/27/19 06/22/19  Van Knee, MD  omeprazole (PRILOSEC) 40 MG capsule Take 40 mg by mouth daily.  01/27/19  [provider]    Family History Family History  Problem Relation Age of Onset   ADD / ADHD Mother    Osteoarthritis Father     Social History Social History   Tobacco Use   Smoking status: Never   Smokeless tobacco: Never  Vaping Use   Vaping status: Never Used  Substance Use Topics   Alcohol use: Yes    Comment: socially   Drug use: No     Allergies   Other, Acyclovir and related, and Peach flavoring agent (non-screening)   Review of Systems Review of Systems  Constitutional:  Negative for fever.  HENT:  Positive for congestion, ear pain, sinus pressure and sinus pain. Negative for ear discharge, rhinorrhea and sore throat.   Respiratory:  Positive for cough and shortness of breath. Negative for wheezing.      Physical Exam Triage Vital Signs ED Triage Vitals  Encounter Vitals Group     BP      Girls Systolic BP Percentile      Girls Diastolic BP Percentile      Boys Systolic BP Percentile      Boys Diastolic BP Percentile      Pulse      Resp      Temp      Temp src      SpO2      Weight      Height      Head Circumference      Peak Flow      Pain Score      Pain Loc      Pain Education      Exclude from Growth Chart    No data found.  Updated Vital Signs BP (!) 150/86 (BP Location: Left Arm)   Pulse 68   Temp 97.6 F (36.4 C) (Oral)   Resp 16   Ht 6' 7 (2.007 m)   Wt (!) 363 lb 12.1 oz  (165 kg)   SpO2 98%   BMI 40.98 kg/m   Visual Acuity Right Eye Distance:   Left Eye Distance:   Bilateral Distance:    Right Eye Near:   Left Eye Near:    Bilateral Near:     Physical Exam Vitals and nursing note reviewed.  Constitutional:      Appearance: Normal appearance. He is not ill-appearing.  HENT:     Head: Normocephalic and atraumatic.     Right Ear: Tympanic membrane, ear canal and external ear normal. There is no impacted cerumen.  Left Ear: Tympanic membrane, ear canal and external ear normal. There is no impacted cerumen.     Nose: Congestion and rhinorrhea present.     Comments: Mucosa is edematous and erythematous with clear discharge in both nares.    Mouth/Throat:     Mouth: Mucous membranes are moist.     Pharynx: Oropharynx is clear. Posterior oropharyngeal erythema present. No oropharyngeal exudate.     Comments: Mild erythema to the posterior oropharynx with clear postnasal drip.  No injection noted.  Tonsillar pillars are surgically absent. Cardiovascular:     Rate and Rhythm: Normal rate and regular rhythm.     Pulses: Normal pulses.     Heart sounds: Normal heart sounds. No murmur heard.    No friction rub. No gallop.  Pulmonary:     Effort: Pulmonary effort is normal.     Breath sounds: Normal breath sounds. No wheezing, rhonchi or rales.  Musculoskeletal:     Cervical back: Normal range of motion and neck supple. No tenderness.  Skin:    General: Skin is warm and dry.     Capillary Refill: Capillary refill takes less than 2 seconds.     Findings: No rash.  Neurological:     General: No focal deficit present.     Mental Status: He is alert and oriented to person, place, and time.      UC Treatments / Results  Labs (all labs ordered are listed, but only abnormal results are displayed) Labs Reviewed  SARS CORONAVIRUS 2 BY RT PCR - Abnormal; Notable for the following components:      Result Value   SARS Coronavirus 2 by RT PCR POSITIVE  (*)    All other components within normal limits    EKG   Radiology No results found.  Procedures Procedures (including critical care time)  Medications Ordered in UC Medications - No data to display  Initial Impression / Assessment and Plan / UC Course  I have reviewed the triage vital signs and the nursing notes.  Pertinent labs & imaging results that were available during my care of the patient were reviewed by me and considered in my medical decision making (see chart for details).   Patient is a nontoxic-appearing 28 year old male presenting for evaluation of respiratory symptoms as outlined HPI above.  His most significant symptom is chest tightness with a nonproductive cough and shortness of breath.  He is able to speak in full sentence without dyspnea or tachypnea.  His respiratory rate at triage was 16 with a 98% room air oxygen saturation.  His lungs are clear to auscultation in all fields.  He is afebrile with an oral temp of 97.6.  Remainder his physical exam is reveal inflammation of his upper respiratory tract as evidenced by inflamed nasal mucosa with clear rhinorrhea.  Also erythema to the posterior oropharynx with clear postnasal drip.  Differential diagnose include COVID, influenza, viral respiratory illness, pneumonia.  Given that he has had symptoms for 3 days I will not test him for influenza but I will order a COVID PCR.  I will also order a chest x-ray to evaluate for any acute cardiopulmonary pathology.  Chest x-ray independently reviewed and evaluated by me.  Impression: Lung fields are well aerated without evidence of filtrate or effusion.  Cardiomediastinal silhouette is at upper limits of normal.  Radiology overread is pending.  COVID PCR is positive.  I will discharge patient with a diagnosis of COVID-19 and start him on Atrovent  nasal spray  for his nasal congestion and sinus pressure.  Tessalon  Perles and Promethazine  DM cough syrup for cough and congestion.   Given that he has been experiencing some shortness of breath I will also prescribe an albuterol  inhaler with a spacer and he can take 1 to 2 puffs every 4-6 hours as needed for shortness breath or wheezing.  Return and ER precautions reviewed.   Final Clinical Impressions(s) / UC Diagnoses   Final diagnoses:  Acute cough  COVID-19     Discharge Instructions      CDC guidelines state that you must wear a mask for the first 5 days of symptoms when you are around other people.  After 5 days you no longer need to mask as you are no longer considered infectious.  There is no longer need to quarantine unless you have a fever.  If you do have a fever then you need to quarantine until you have been fever free for 24 hours without taking Tylenol  and/or ibuprofen .  Use over-the-counter Tylenol  and/or ibuprofen  according to the package instructions as needed for fever and pain.  Use the Atrovent  nasal spray, 2 squirts up each nostril every 6 hours, as needed for nasal congestion and runny nose.  Use the Tessalon  Perles every 8 hours during the day as needed for cough.  Take them with a small sip of water.  You may experience numbness to the base of your tongue or metallic taste in her mouth, this is normal.  Use the Promethazine  DM cough syrup at bedtime as needed for cough and congestion.  Be mindful this medication will make you sleepy.  Use the albuterol  inhaler with the spacer, 1 to 2 puffs every 4-6 hours, as needed for any shortness of breath or if you develop wheezing.  If you develop any worsening respiratory symptoms such as shortness of breath, shortness of breath at rest, feel as though you cannot catch your breath, you are unable to speak in full sentences, or, as a late sign, your lips begin turning blue you need to call 911 and go to the ER for evaluation.      ED Prescriptions     Medication Sig Dispense Auth. Provider   Spacer/Aero-Holding Chambers (AEROCHAMBER MV) inhaler Use  as instructed 1 each Bernardino Ditch, NP   albuterol  (VENTOLIN  HFA) 108 (90 Base) MCG/ACT inhaler Inhale 2 puffs into the lungs every 4 (four) hours as needed. 18 g Bernardino Ditch, NP   benzonatate  (TESSALON ) 100 MG capsule Take 2 capsules (200 mg total) by mouth every 8 (eight) hours. 21 capsule Bernardino Ditch, NP   ipratropium (ATROVENT ) 0.06 % nasal spray Place 2 sprays into both nostrils 4 (four) times daily. 15 mL Bernardino Ditch, NP   promethazine -dextromethorphan (PROMETHAZINE -DM) 6.25-15 MG/5ML syrup Take 5 mLs by mouth 4 (four) times daily as needed. 118 mL Bernardino Ditch, NP      PDMP not reviewed this encounter.   Bernardino Ditch, NP 12/25/23 [phone]

## 2023-12-25 NOTE — ED Triage Notes (Addendum)
 Patient states that last week he had a fever of 101.  Patient reports ongoing cough and SOB.  Patient reports sinus pressure and pain.  Patient reports chest tightness when he is only coughing or taking a deep breath.  Patient is on Keflex prescribed by his Dermatologist.

## 2023-12-25 NOTE — Discharge Instructions (Addendum)
 CDC guidelines state that you must wear a mask for the first 5 days of symptoms when you are around other people.  After 5 days you no longer need to mask as you are no longer considered infectious.  There is no longer need to quarantine unless you have a fever.  If you do have a fever then you need to quarantine until you have been fever free for 24 hours without taking Tylenol  and/or ibuprofen .  Use over-the-counter Tylenol  and/or ibuprofen  according to the package instructions as needed for fever and pain.  Use the Atrovent  nasal spray, 2 squirts up each nostril every 6 hours, as needed for nasal congestion and runny nose.  Use the Tessalon  Perles every 8 hours during the day as needed for cough.  Take them with a small sip of water.  You may experience numbness to the base of your tongue or metallic taste in her mouth, this is normal.  Use the Promethazine  DM cough syrup at bedtime as needed for cough and congestion.  Be mindful this medication will make you sleepy.  Use the albuterol  inhaler with the spacer, 1 to 2 puffs every 4-6 hours, as needed for any shortness of breath or if you develop wheezing.  If you develop any worsening respiratory symptoms such as shortness of breath, shortness of breath at rest, feel as though you cannot catch your breath, you are unable to speak in full sentences, or, as a late sign, your lips begin turning blue you need to call 911 and go to the ER for evaluation.
